# Patient Record
Sex: Male | Born: 1940 | Race: White | Hispanic: No | Marital: Married
Health system: Midwestern US, Academic
[De-identification: ages and names within clinical notes are randomized; demographics above are authoritative.]

---

## 2015-07-21 IMAGING — CR LOW_EXM
1 series · 1 of 1 positions shown · non-contrast
Comparison: none

[knee lat]
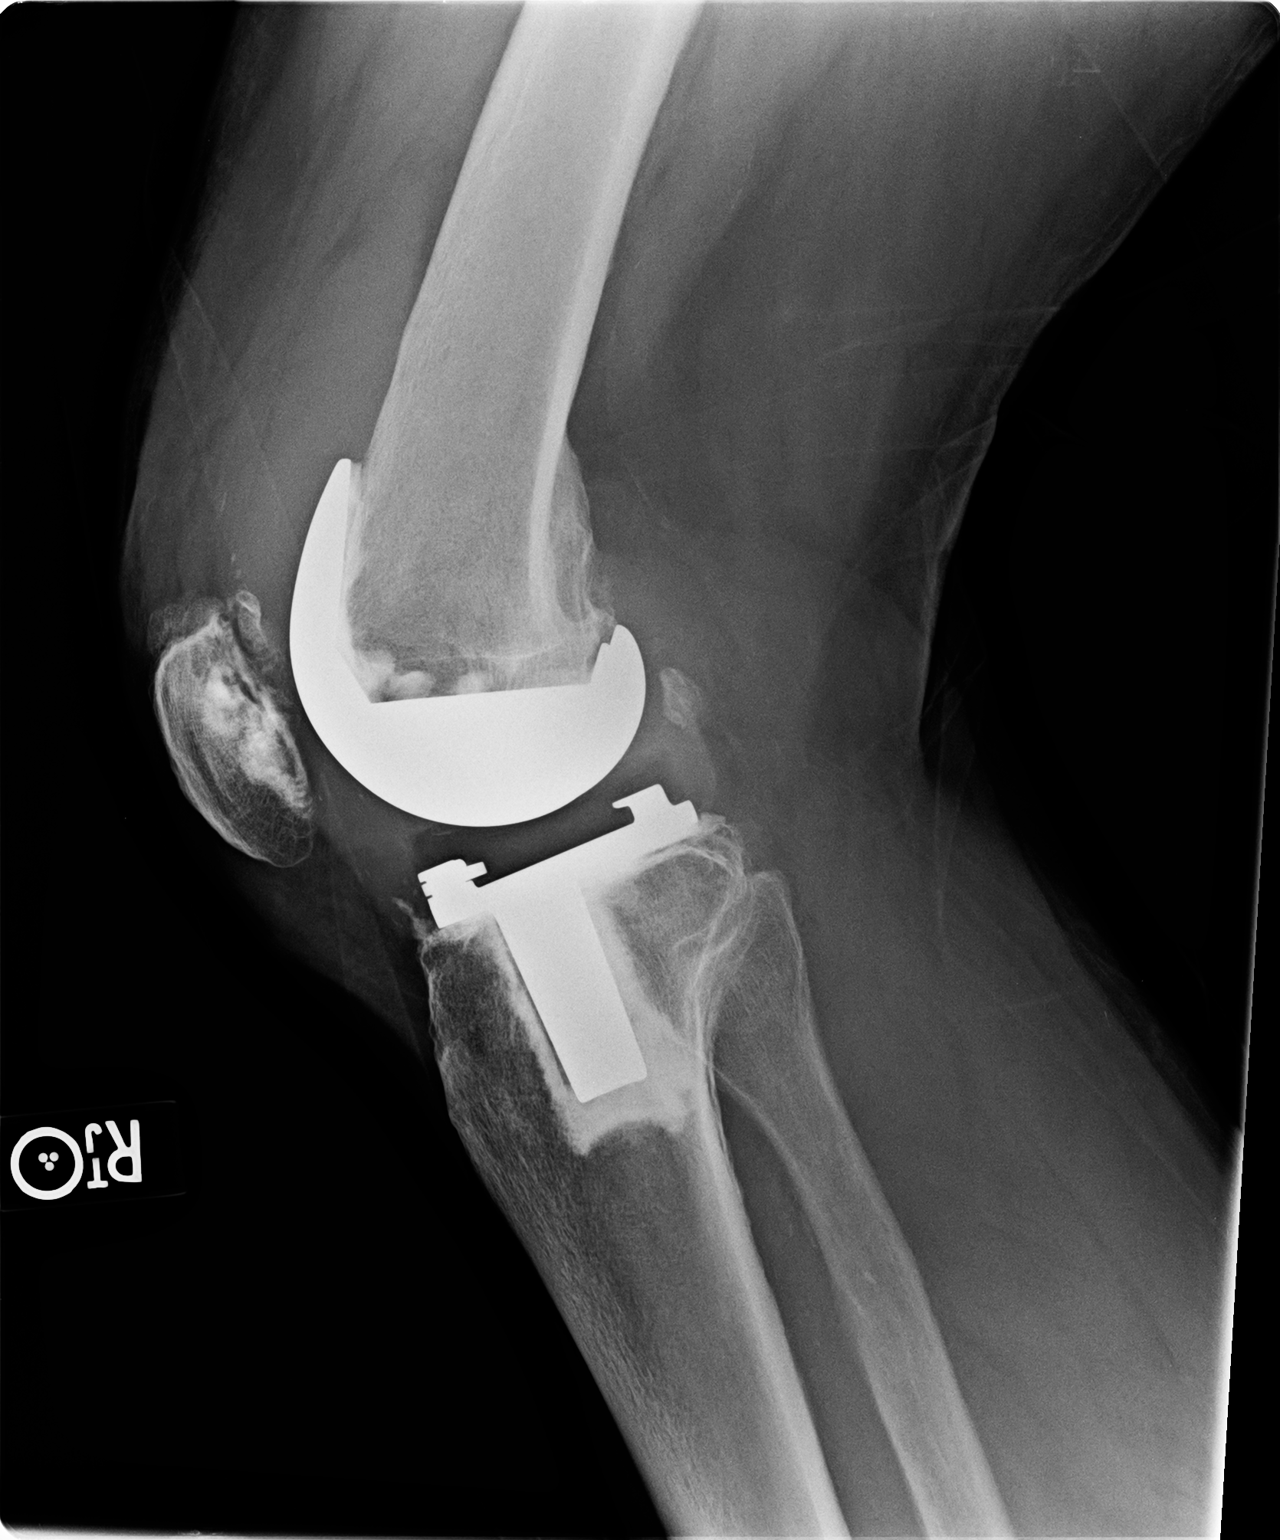

[1 of 1 positions shown; findings below may reference images not displayed]

EXAM

Right knee.

INDICATION

fell at home today. c/o pain and swelling
Fell going down driveway this morning. Pain and swelling knee and lateral
ankle

FINDINGS

AP and lateral right knee show a moderate to large joint effusion. Total knee arthroplasty in
anatomic alignment. Ossification is appreciated at the posterior superior patellar pole with
fragmented enthesophytes also suggested. There is no compelling evidence of hardware failure or
loosening.

IMPRESSION

No periprosthetic fracture. Joint effusion. There is a smoothly marginated calcification at the
posterior aspect of the superior patella.

## 2016-02-28 IMAGING — CR UP_EXM
3 series · 3 of 3 positions shown · non-contrast
Comparison: none

[hand]
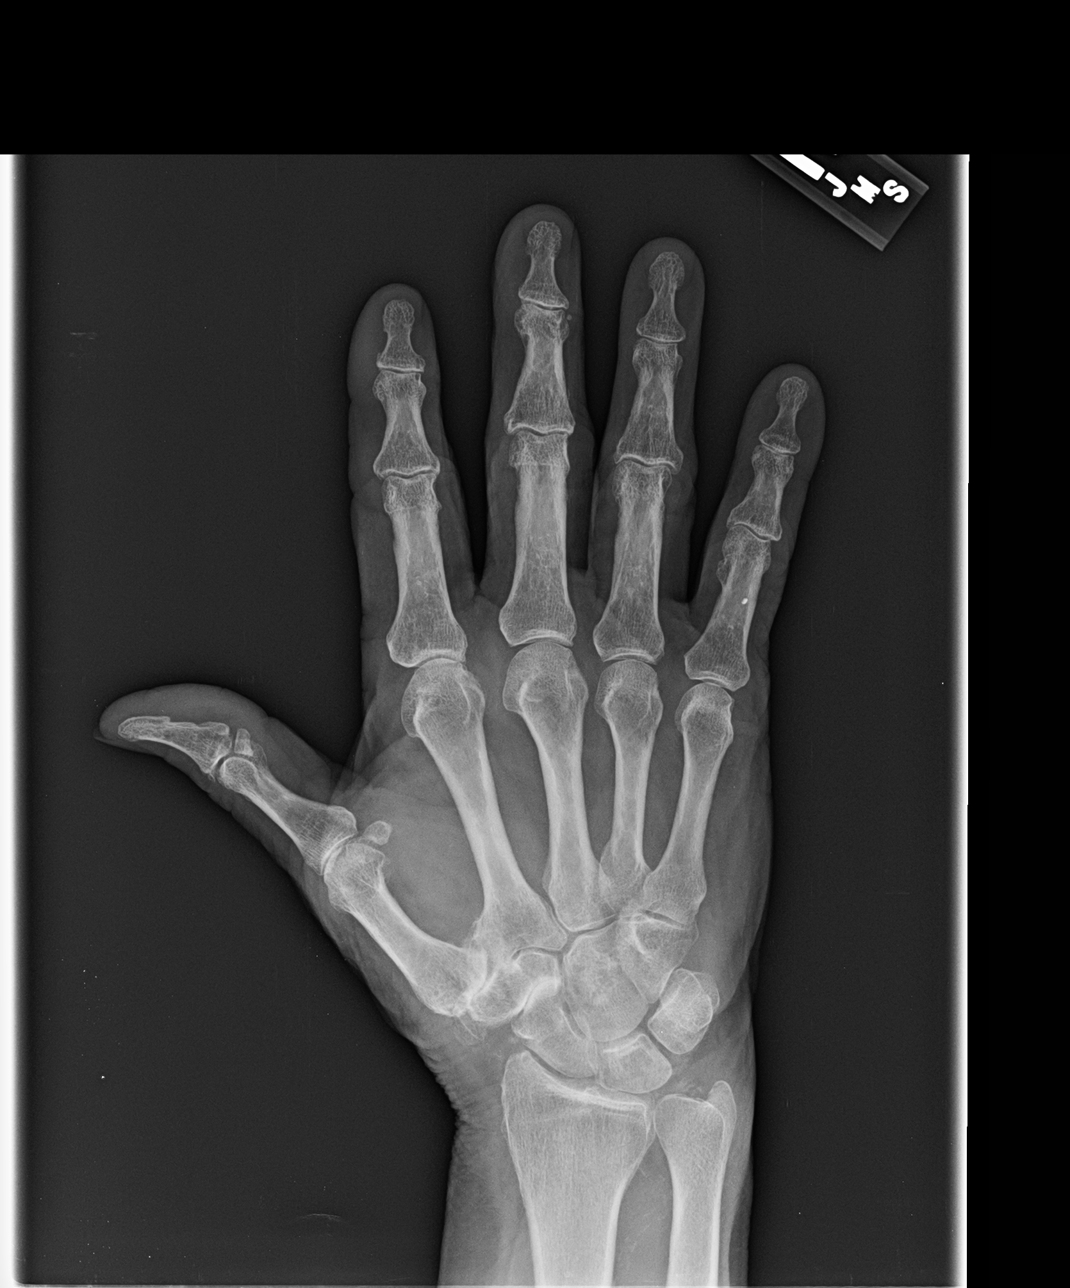

[hand obl]
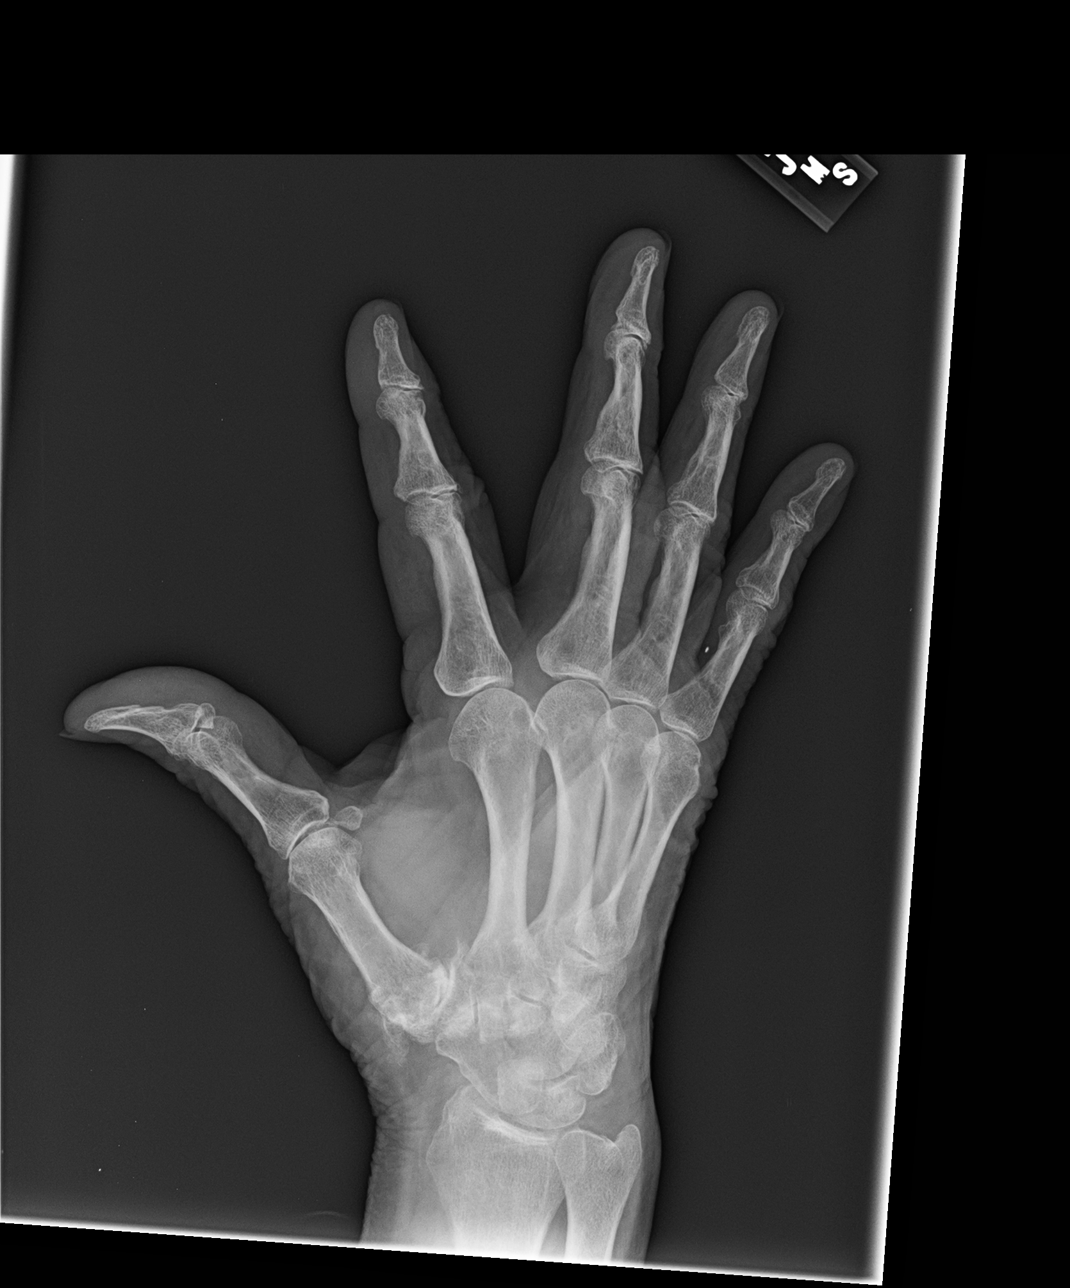

[hand lat]
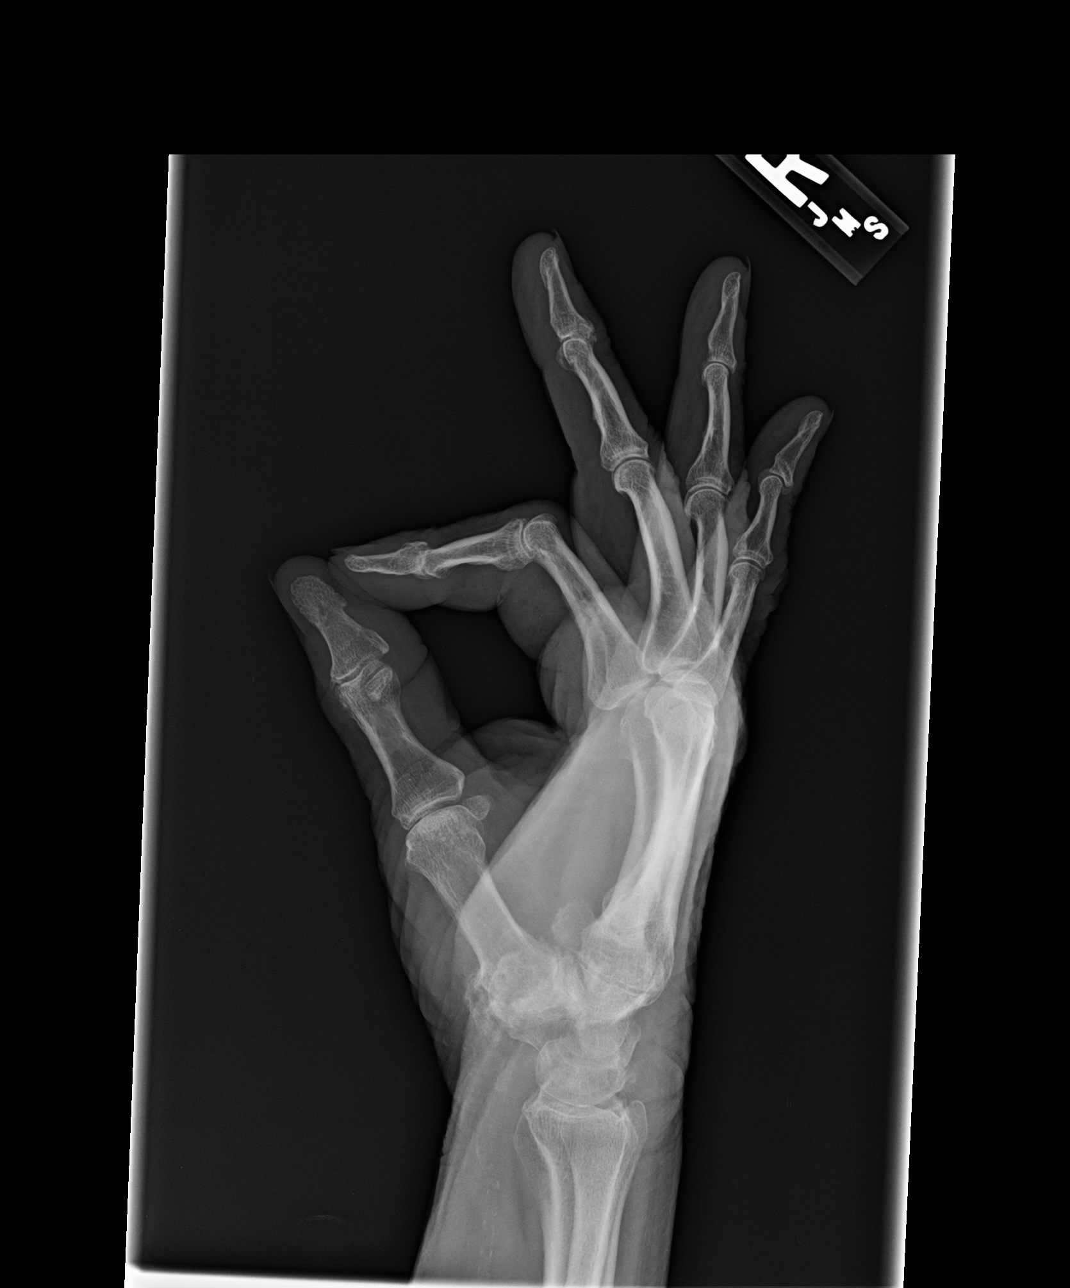

[3 of 3 positions shown; findings below may reference images not displayed]

DIAGNOSTIC STUDIES

EXAM
Right hand radiograph

INDICATION
intermittent increase of right hand numbness and tingling.
PT STATES HAS RIGHT HAND NUMBNESS AND TINGLING THAT IS REOCURRING X FEW
MONTHS. JS/TJ

TECHNIQUE
PA, oblique, and lateral views of the right hand

COMPARISONS
None

FINDINGS
The bones are diffusely demineralized. No acute fracture or malalignment. There is severe 1st
carpometacarpal degenerative joint disease with subchondral sclerosis and large osteophytes. Severe
triscaphe degenerative joint disease. Moderate degenerative joint disease of the thumb
metacarpophalangeal joint and scattered throughout the interphalangeal joints with joint space
narrowing and small osteophytes. There is mild joint space narrowing of the radiocarpal joint.
There are sclerotic changes of the capitate. Chondrocalcinosis is seen in the triangular
fibrocartilage.

IMPRESSION
Multifocal degenerative joint disease, greatest at the 1st carpometacarpal and triscaphe joints
where it is severe.

]

## 2016-09-11 IMAGING — CR PELVIS
1 series · 1 of 1 positions shown · non-contrast
Comparison: none

[l-spine ap]
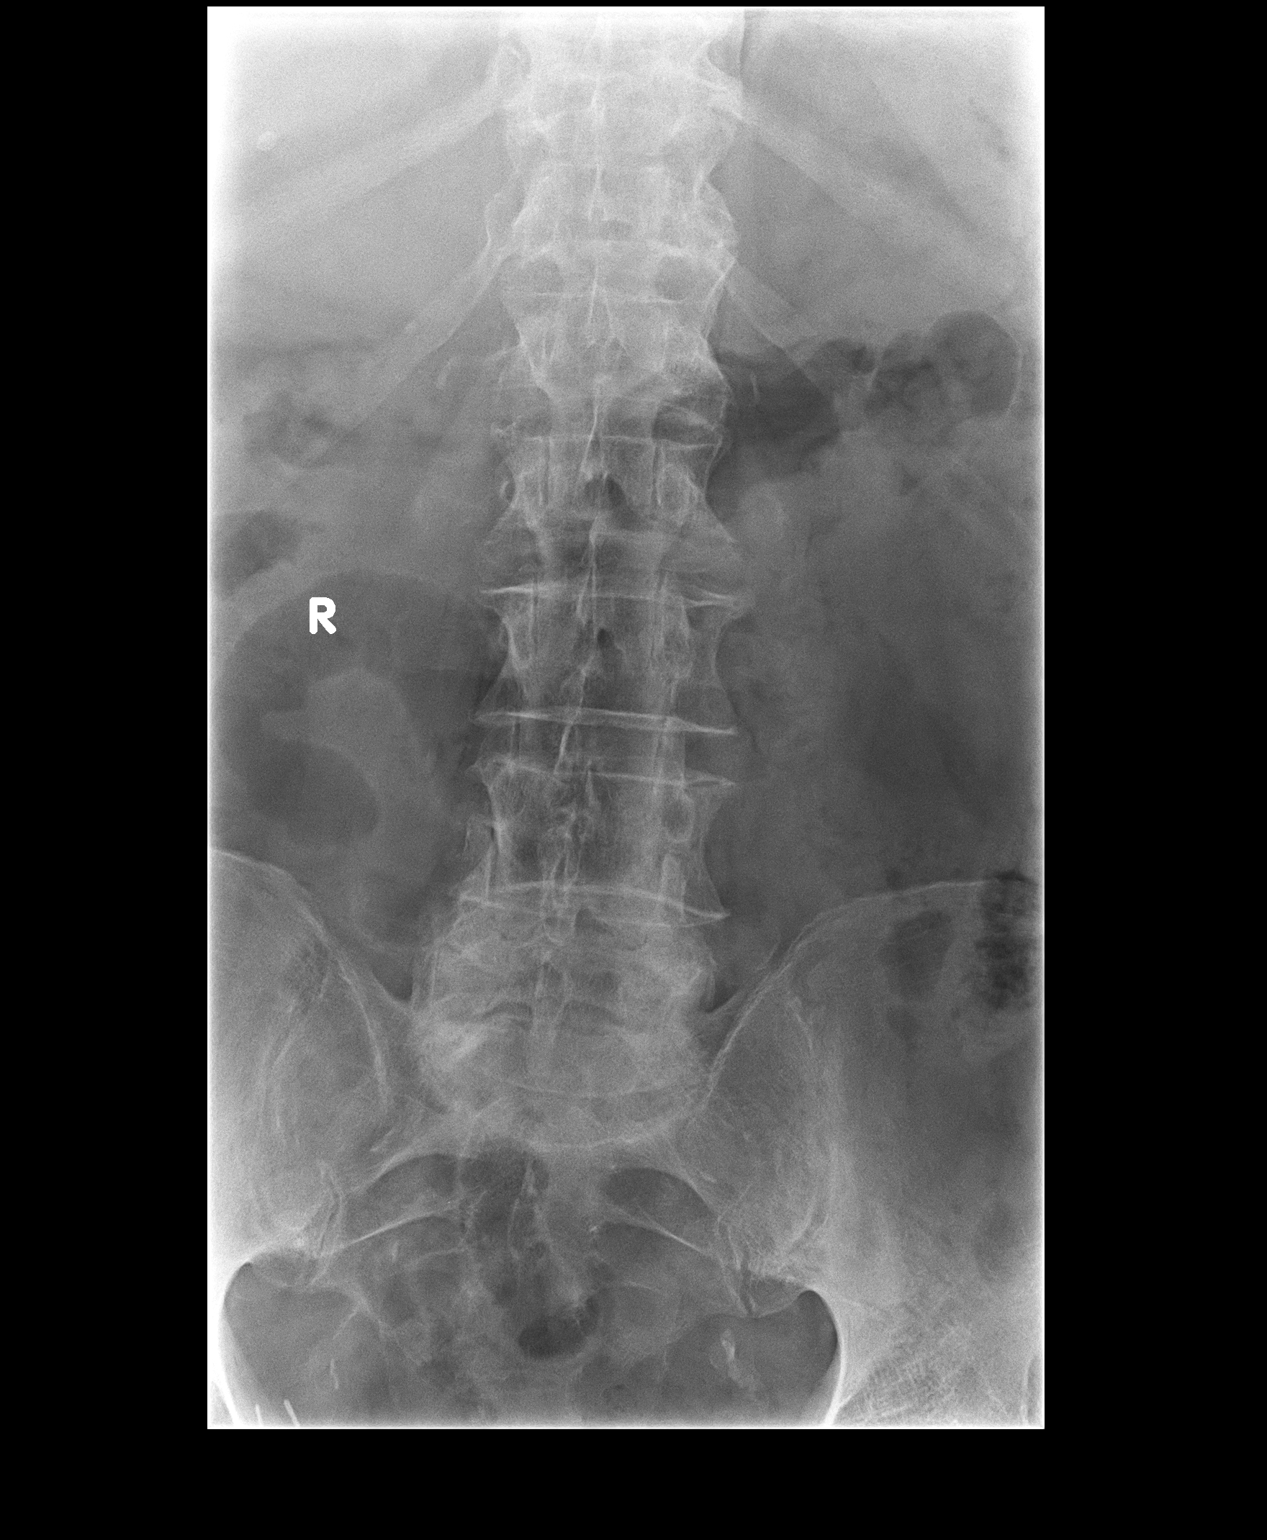

[1 of 1 positions shown; findings below may reference images not displayed]

DIAGNOSTIC STUDIES

EXAM

Lumbar spine radiograph

INDICATION

chronic intermittent low back pain and lower leg pain (bilateral)
PT STATES BILATERAL LEG PAIN X 3-4 MONTHS. HX OF BILATERAL KNEE
REPLACEMENTS. SB/TJ

TECHNIQUE

Five views of the lumbar spine

COMPARISONS

None

FINDINGS

There are 5 non-rib-bearing lumbar vertebral bodies. The bones are demineralized. No acute
fracture. There is 4 millimeter anterolisthesis of L4 on L5. There is moderate disc space narrowing
at L5-S1. Severe facet arthrosis at L4-5 and L5-S1.

IMPRESSION

1. Osteopenia.

2. Moderate degenerative disc disease at L5-S1.

3. Severe facet arthrosis at L4-5 and L5-S1.

## 2016-09-25 ENCOUNTER — Encounter: Admit: 2016-09-25 | Discharge: 2016-09-26 | Payer: MEDICARE

## 2016-09-25 DIAGNOSIS — R69 Illness, unspecified: Principal | ICD-10-CM

## 2016-10-12 LAB — PROSTATIC SPECIFIC ANTIGEN-PSA

## 2016-10-27 ENCOUNTER — Encounter: Admit: 2016-10-27 | Discharge: 2016-10-27 | Payer: MEDICARE

## 2016-10-27 ENCOUNTER — Ambulatory Visit: Admit: 2016-10-27 | Discharge: 2016-10-27 | Payer: MEDICARE

## 2016-10-27 DIAGNOSIS — I10 Essential (primary) hypertension: ICD-10-CM

## 2016-10-27 DIAGNOSIS — Z8546 Personal history of malignant neoplasm of prostate: Principal | ICD-10-CM

## 2016-10-27 DIAGNOSIS — K219 Gastro-esophageal reflux disease without esophagitis: ICD-10-CM

## 2016-10-27 DIAGNOSIS — E119 Type 2 diabetes mellitus without complications: ICD-10-CM

## 2016-10-27 DIAGNOSIS — K222 Esophageal obstruction: ICD-10-CM

## 2016-10-27 DIAGNOSIS — C61 Malignant neoplasm of prostate: Principal | ICD-10-CM

## 2016-10-27 DIAGNOSIS — E785 Hyperlipidemia, unspecified: ICD-10-CM

## 2016-10-27 DIAGNOSIS — N393 Stress incontinence (female) (male): ICD-10-CM

## 2016-10-27 DIAGNOSIS — N5231 Erectile dysfunction following radical prostatectomy: ICD-10-CM

## 2016-10-27 DIAGNOSIS — N529 Male erectile dysfunction, unspecified: ICD-10-CM

## 2016-10-27 NOTE — Progress Notes
Date of Service: 10/27/2016     Subjective:             Devin Kelly is a 76 y.o. male.    Chief Complaint   Patient presents with   ??? Prostate Cancer       History of Present Illness  Very pleasant Caucasian gentleman w/ prostate cancer s/p non-nerve sparing robotic asst lap prostatectomy (RALP) on 09/25/2011, by Dr. Laveda Norman.  Post-Prostatectomy PSA undetectable, thus far.      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 Larita Fife.  SUI significantly improved down to 1 light security pad/ day.  Denies obstructive LUTS, dysuria, gross hematuria, or UTI.    (+)baseline ED prior to non-nerve sparing RALP.  Not a quality of life issue, not interested in ED tx options.      No new urologic concerns or complaints.    HPI reviewed on 10/27/2016 & unchanged since last visit.         Review of Systems   Constitutional: Negative for activity change, appetite change, chills, diaphoresis, fatigue, fever and unexpected weight change.   HENT: Negative for congestion, hearing loss, mouth sores and sinus pressure.    Eyes: Negative for visual disturbance.   Respiratory: Negative for apnea, cough, chest tightness and shortness of breath.    Cardiovascular: Negative for chest pain, palpitations and leg swelling.   Gastrointestinal: Negative for abdominal pain, blood in stool, constipation, diarrhea, nausea, rectal pain and vomiting.   Genitourinary: Negative for decreased urine volume, difficulty urinating, discharge, dysuria, enuresis, flank pain, frequency, genital sores, hematuria, penile pain, penile swelling, scrotal swelling, testicular pain and urgency.   Musculoskeletal: Negative for arthralgias, back pain, gait problem and myalgias.   Skin: Negative for rash and wound.   Neurological: Negative for dizziness, tremors, seizures, syncope, weakness, light-headedness, numbness and headaches.   Hematological: Negative for adenopathy. Does not bruise/bleed easily. Psychiatric/Behavioral: Negative for decreased concentration and dysphoric mood. The patient is not nervous/anxious.        Objective:         ??? aspirin-caffeine 500-32.5 mg tab Take 2 Tabs by mouth twice daily.   ??? cyanocobalamin(+) (VITAMIN B-12) 100 mcg tablet Take 100 mcg by mouth twice daily.   ??? enalapril/hydrochlorothiazide (VASERETIC) 10/25 mg tablet Take 1 Tab by mouth at bedtime daily.   ??? fenofibrate nanocrystallized (TRICOR) 145 mg tablet Take 145 mg by mouth daily.   ??? fish oil /omega-3 fatty acids (SEA-OMEGA) 340/1000 mg capsule Take 4 Caps by mouth daily.   ??? glipiZIDE CR (GLUCOTROL XL) 5 mg tablet Take 5 mg by mouth daily.   ??? HYDROcodone/acetaminophen (NORCO; VICODIN) 5-325 mg tablet Take 1 Tab by mouth every 6 hours as needed for Pain   ??? metFORMIN (GLUCOPHAGE) 1,000 mg tablet Take 1,000 mg by mouth twice daily with meals.     ??? MULTIVITAMIN PO Take 1 Tab by mouth at bedtime daily.   ??? omeprazole DR(+) (PRILOSEC) 20 mg capsule Take 20 mg by mouth at bedtime daily.   ??? triamcinolone (NASACORT AQ) 55 mcg nasal inhaler Apply 2 Sprays to each nostril as directed daily as needed.       Vitals:    10/27/16 0924   BP: 132/55   Pulse: 59   Weight: 100 kg (220 lb 6.4 oz)   Height: 180.3 cm (71)       Body mass index is 30.74 kg/m???.       Physical Exam  Constitutional: He is oriented to person, place, and time. He appears well-developed and well-nourished. No distress.   HENT:   Head: Normocephalic and atraumatic.   Eyes: No scleral icterus.   Abdominal: Soft. He exhibits no distension. Hernia confirmed negative in the right inguinal area and confirmed negative in the left inguinal area.   Well healed surgical scars.  Obese.  (+)RLQ AUS reservoir scar.   Genitourinary: Testes normal and penis normal. Right testis shows no mass, no swelling and no tenderness. Right testis is descended. Left testis shows no mass, no swelling and no tenderness. Left testis is descended. Uncircumcised. No phimosis, paraphimosis, hypospadias, penile erythema or penile tenderness. No discharge found.   Genitourinary Comments: Foreskin easily retracted.  No penile lesions.  (+)AUS pump easily palpated in (R) hemiscrotum.  AUS pump in good position.   Lymphadenopathy:        Right: No inguinal adenopathy present.        Left: No inguinal adenopathy present.   Neurological: He is alert and oriented to person, place, and time. No cranial nerve deficit (II-XII grossly intact).   Skin: Skin is warm and dry. No erythema. No pallor.   Psychiatric: He has a normal mood and affect. His behavior is normal.   Vitals reviewed.        Labs:  Outside PSA (10/12/2016) < 0.13 ng/mL.  Will scan to EMR.      Assessment and Plan:    Problem   History of Prostate Cancer    PSA (07/19/2011) = 5.49 ng/mL.  PNBx (08/10/2011): cT1c; (L) Gleason 3+3=6, 4/6 cores, 20-55%; (R) Gleason 3+3=6, 4/6 cores, 30-80%; Dr. Larwance Rote.  Non-Nerve Sparing Robotic Asst Lap Prostatectomy (RALP) -- 09/25/2011; Dr. Laveda Norman.  pT2c N0 Mx, Gleason 3+3=6, Margins Negative.     Fleeta Emmer (Stress Urinary Incontinence), Male    Post-prostatectomy SUI; 8-10 pads/ d.  Artificial urinary sphincter (AUS) -- 07/07/2013; Dr. Larita Fife.  Post-AUS ~ 1 light security pad/ d.     Erectile Dysfunction Following Radical Prostatectomy    (+)baseline ED prior to prostatectomy.  Failed Viagra & Levitra.  S/p Non-nerve sparing prostatectomy.         History of prostate cancer  PSA reviewed & remains undetectable.  RTC 1 yr w/ outside PSA.    Erectile dysfunction following radical prostatectomy  Not a quality of life issue at this time.  No further intervention required at this time.    SUI (stress urinary incontinence), male  SUI significantly improved w/ AUS.    He remains very pleased w/ results.    Orders Placed This Encounter   ??? PROSTATIC SPECIFIC ANTIGEN-PSA       Marin Roberts, PA-C  Urology

## 2016-10-28 ENCOUNTER — Encounter: Admit: 2016-10-28 | Discharge: 2016-10-28 | Payer: MEDICARE

## 2016-10-28 DIAGNOSIS — E119 Type 2 diabetes mellitus without complications: ICD-10-CM

## 2016-10-28 DIAGNOSIS — E785 Hyperlipidemia, unspecified: ICD-10-CM

## 2016-10-28 DIAGNOSIS — N529 Male erectile dysfunction, unspecified: ICD-10-CM

## 2016-10-28 DIAGNOSIS — I10 Essential (primary) hypertension: ICD-10-CM

## 2016-10-28 DIAGNOSIS — C61 Malignant neoplasm of prostate: Principal | ICD-10-CM

## 2016-10-28 DIAGNOSIS — N393 Stress incontinence (female) (male): ICD-10-CM

## 2016-10-28 DIAGNOSIS — K222 Esophageal obstruction: ICD-10-CM

## 2016-10-28 DIAGNOSIS — K219 Gastro-esophageal reflux disease without esophagitis: ICD-10-CM

## 2016-10-28 NOTE — Assessment & Plan Note
PSA reviewed & remains undetectable.  RTC 1 yr w/ outside PSA.

## 2016-10-28 NOTE — Assessment & Plan Note
Not a quality of life issue at this time.  No further intervention required at this time.

## 2016-10-28 NOTE — Assessment & Plan Note
SUI significantly improved w/ AUS.    He remains very pleased w/ results.

## 2016-10-30 ENCOUNTER — Encounter: Admit: 2016-10-30 | Discharge: 2016-10-30 | Payer: MEDICARE

## 2016-10-30 DIAGNOSIS — Z8546 Personal history of malignant neoplasm of prostate: Principal | ICD-10-CM

## 2016-11-10 ENCOUNTER — Ambulatory Visit: Admit: 2016-11-10 | Discharge: 2016-11-11 | Payer: MEDICARE

## 2016-11-10 ENCOUNTER — Encounter: Admit: 2016-11-10 | Discharge: 2016-11-10 | Payer: MEDICARE

## 2016-11-10 DIAGNOSIS — I10 Essential (primary) hypertension: ICD-10-CM

## 2016-11-10 DIAGNOSIS — N393 Stress incontinence (female) (male): ICD-10-CM

## 2016-11-10 DIAGNOSIS — C61 Malignant neoplasm of prostate: Principal | ICD-10-CM

## 2016-11-10 DIAGNOSIS — R69 Illness, unspecified: Principal | ICD-10-CM

## 2016-11-10 DIAGNOSIS — E785 Hyperlipidemia, unspecified: ICD-10-CM

## 2016-11-10 DIAGNOSIS — K219 Gastro-esophageal reflux disease without esophagitis: ICD-10-CM

## 2016-11-10 DIAGNOSIS — K222 Esophageal obstruction: ICD-10-CM

## 2016-11-10 DIAGNOSIS — N529 Male erectile dysfunction, unspecified: ICD-10-CM

## 2016-11-10 DIAGNOSIS — E119 Type 2 diabetes mellitus without complications: ICD-10-CM

## 2016-11-10 NOTE — Progress Notes
[facility]  NEW PATIENT HISTORY AND PHYSICAL  Subjective     Chief Complaint: LBP    HPI: Devin Kelly is a 76 y.o. male who  has a past medical history of Diabetes mellitus, type II (HCC); Dyslipidemia; ED (erectile dysfunction); Esophageal stricture; GERD (gastroesophageal reflux disease); Hypertension; Prostate cancer (HCC) (08/10/2011); and SUI (stress urinary incontinence), male. who now presents for initial consultation.    The pain is in the low back, which is more minor.   The worse pain is radiating pain down the bilateral posterior legs R>L.    Pain started: 6 months ago    Initial inciting injury or event: No    Numbness/tingling: Yes    The pain ranges 2-9/10    The pain is described as stabbing, shooting, sharp.     The pain is exacerbated by walking at times.     The pain is partially alleviated by taking weight off the legs and medications.     Denies substantial muscle tightness/stiffness.          PRIOR MEDICATIONS:   Effective  NSAID    Ineffective      Unable to tolerate      Never  Gabapentin  Lyrica  Ami/Nortriptyline  Cymbalta  Tizanidine      PRIOR INTERVENTIONS:  No spine surgery or injections  Effective      Ineffective  Chiropractor  Exercise, limited by pain      Devin Kelly denies any recent fevers, chills, infection, antibiotics, bowel or bladder incontinence, saddle anesthesia, bleeding issues, or recent anticoagulant.     ROS: All 14 systems reviewed and found to be negative except as above and as follows.    Past Medical History:  Past Medical History:   Diagnosis Date   ??? Diabetes mellitus, type II (HCC)    ??? Dyslipidemia    ??? ED (erectile dysfunction)     Baseline ED prior to prostatectomy.  Post-prostatectomy ED.   ??? Esophageal stricture    ??? GERD (gastroesophageal reflux disease)    ??? Hypertension    ??? Prostate cancer (HCC) 08/10/2011   ??? SUI (stress urinary incontinence), male     Post-prostatectomy SUI       Family History: Family History   Problem Relation Age of Onset   ??? Diabetes Other    ??? Asthma Sister    ??? Asthma Brother    ??? Diabetes Maternal Aunt    ??? 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   Social History     Social History   ??? Marital status: Married     Spouse name: N/A   ??? Number of children: N/A   ??? Years of education: N/A     Occupational History   ??? Not on file.     Social History Main Topics   ??? Smoking status: Former Smoker     Packs/day: 1.00     Years: 20.00     Types: Cigarettes     Quit date: 12/19/1991   ??? Smokeless tobacco: Never Used   ??? Alcohol use No      Comment: Quit 1996   ??? Drug use: No   ??? Sexual activity: Not Currently     Partners: Female     Other Topics Concern   ??? Not on file  Social History Narrative   ??? No narrative on file       Allergies:  Allergies   Allergen Reactions   ??? Crestor [Rosuvastatin] MUSCLE PAIN       Medications:    Current Outpatient Prescriptions:   ???  aspirin-caffeine 500-32.5 mg tab, Take 2 Tabs by mouth twice daily., Disp: , Rfl:   ???  cyanocobalamin(+) (VITAMIN B-12) 100 mcg tablet, Take 100 mcg by mouth twice daily., Disp: , Rfl:   ???  enalapril/hydrochlorothiazide (VASERETIC) 10/25 mg tablet, Take 1 Tab by mouth at bedtime daily., Disp: , Rfl:   ???  fenofibrate nanocrystallized (TRICOR) 145 mg tablet, Take 145 mg by mouth daily., Disp: , Rfl:   ???  fish oil /omega-3 fatty acids (SEA-OMEGA) 340/1000 mg capsule, Take 4 Caps by mouth daily., Disp: , Rfl:   ???  glipiZIDE CR (GLUCOTROL XL) 5 mg tablet, Take 5 mg by mouth daily., Disp: , Rfl:   ???  HYDROcodone/acetaminophen (NORCO; VICODIN) 5-325 mg tablet, Take 1 Tab by mouth every 6 hours as needed for Pain, Disp: 30 Tab, Rfl: 0  ???  metFORMIN (GLUCOPHAGE) 1,000 mg tablet, Take 1,000 mg by mouth twice daily with meals.  , Disp: , Rfl:   ???  MULTIVITAMIN PO, Take 1 Tab by mouth at bedtime daily., Disp: , Rfl: ???  omeprazole DR(+) (PRILOSEC) 20 mg capsule, Take 20 mg by mouth at bedtime daily., Disp: , Rfl:   ???  triamcinolone (NASACORT AQ) 55 mcg nasal inhaler, Apply 2 Sprays to each nostril as directed daily as needed., Disp: , Rfl:     Physical examination:   BP 131/82  - Pulse 67  - Resp 15  - Ht 180.3 cm (71)  - Wt 100.7 kg (222 lb)  - BMI 30.96 kg/m???   Pain Score: Five    General: The patient is a well-developed, somewhat overweight 76 y.o. male in no acute distress.   HEENT: Head is normocephalic and atraumatic. Pupils are equal and reactive to light bilaterally.   Cardiac: Based on palpation, pulse appears to be regular rate and rhythm.   Pulmonary: The patient has unlabored respirations and bilateral symmetric chest excursion.   Abdomen: Soft, nontender, and nondistended. There is no rebound or guarding.   Extremities: No clubbing, cyanosis, or edema.     Neurologic:   The patient is alert and oriented times 3.   Cranial nerves II through XII are intact without any focal deficits.   There is no sensory deficit to light touch or pinprick in the affected areas. There is no allodynia noted.    Musculoskeletal:   Gait is normal.    Well-healed scars in bilateral knees from TKR.     L-Spine   There is no point vertebral tenderness in the midline.    There is mild-min low lumbar paraspinal tenderness. Paraspinal muscle tone is normal.  Facet loading is negative.  There is no tenderness or radiating pain with palpation over the SI joints, piriformis, or greater trochanteric bursae bilaterally.  ROM with flexion, extension, rotation, and lateral bending is intact.  Strength is equal and adequate bilaterally in the flexors and extensors of the bilateral lower extremities.   Reflexes are 2/4 at the patella and achilles bilaterally.   SLR is positive bilaterally.      MRI L-Spine Results:  [facility]  09/2016  L1-2 mild FA  L2-3 Diff DB. Mild FA. Mod R and mild-mod L NFS  L3-4 Diff DB. Mild FA. Mild SS.  Mild R and mil L NFS L4-5 Post DOC. Sev FA. Mod-sev SS. Mod-sev R and L NFS.   L5-S1 Diff DB with central protrusion. Mild SS. Mild FA. Mild-mod R and mod-sev L NFS.       Last Cr and LFT's:  Creatinine   Date Value Ref Range Status   07/29/2014 0.95 0.4 - 1.24 MG/DL Final          Assessment:    Devin Kelly is a 76 y.o. male who  has a past medical history of Diabetes mellitus, type II (HCC); Dyslipidemia; ED (erectile dysfunction); Esophageal stricture; GERD (gastroesophageal reflux disease); Hypertension; Prostate cancer (HCC) (08/10/2011); and SUI (stress urinary incontinence), male. who presents for evaluation of LBP.     The pain complaints are most likely due to:    1. Lumbosacral radiculopathy  [place] [facility] SP[facility] INJECT SNRB/TFESI LUMBAR/SACRAL   2. DDD (degenerative disc disease), lumbosacral  [place] [facility] INJECT SNRB/TFESI LUMBAR/SACRAL       Patient has had an adequate trial of > 3 months of rest, exercise, NSAID's, multimodal treatment, and the passage of time without improvement of symptoms. The pain has significant impact on the daily quality of life.     Plan:    1. Plan for Bilateral L5-S1 TFESI at first available appointment.   2. If pain persists, consider trial of gabapentin or Cymbalta.   3. Follow up as needed.     Risks/benefits of all pharmacologic and interventional treatments discussed and questions answered.     Thank you for this kind referral for consultation. Please feel free to contact me with any questions or concerns.

## 2016-11-11 DIAGNOSIS — M5417 Radiculopathy, lumbosacral region: Principal | ICD-10-CM

## 2016-11-11 DIAGNOSIS — M5137 Other intervertebral disc degeneration, lumbosacral region: ICD-10-CM

## 2016-11-15 ENCOUNTER — Ambulatory Visit: Admit: 2016-11-15 | Discharge: 2016-11-16 | Payer: MEDICARE

## 2016-11-15 ENCOUNTER — Encounter: Admit: 2016-11-15 | Discharge: 2016-11-15 | Payer: MEDICARE

## 2016-11-15 DIAGNOSIS — M5416 Radiculopathy, lumbar region: Principal | ICD-10-CM

## 2016-11-16 DIAGNOSIS — M5416 Radiculopathy, lumbar region: Principal | ICD-10-CM

## 2016-11-20 ENCOUNTER — Encounter: Admit: 2016-11-20 | Discharge: 2016-11-20 | Payer: MEDICARE

## 2016-11-20 DIAGNOSIS — M5416 Radiculopathy, lumbar region: Principal | ICD-10-CM

## 2016-11-20 NOTE — Telephone Encounter
Patient requests to repeat the Bil TFESI with Dr. Zollie Scale. The first one helped, but not for long enough. He would like to schedule 2 more TFESI, review that Dr. Zollie Scale prefers to schedule one at a time to make sure it is necessary.

## 2016-12-05 ENCOUNTER — Encounter: Admit: 2016-12-05 | Discharge: 2016-12-05 | Payer: MEDICARE

## 2016-12-05 ENCOUNTER — Ambulatory Visit: Admit: 2016-12-05 | Discharge: 2016-12-06 | Payer: MEDICARE

## 2016-12-05 ENCOUNTER — Ambulatory Visit: Admit: 2016-12-05 | Discharge: 2016-12-05 | Payer: MEDICARE

## 2016-12-05 DIAGNOSIS — M5416 Radiculopathy, lumbar region: Principal | ICD-10-CM

## 2016-12-18 ENCOUNTER — Encounter: Admit: 2016-12-18 | Discharge: 2016-12-18 | Payer: MEDICARE

## 2016-12-18 DIAGNOSIS — M549 Dorsalgia, unspecified: Principal | ICD-10-CM

## 2016-12-27 ENCOUNTER — Ambulatory Visit: Admit: 2016-12-27 | Discharge: 2016-12-27 | Payer: MEDICARE

## 2016-12-27 ENCOUNTER — Encounter: Admit: 2016-12-27 | Discharge: 2016-12-27 | Payer: MEDICARE

## 2016-12-27 DIAGNOSIS — M549 Dorsalgia, unspecified: Principal | ICD-10-CM

## 2016-12-27 DIAGNOSIS — E119 Type 2 diabetes mellitus without complications: ICD-10-CM

## 2016-12-27 DIAGNOSIS — M48061 Spinal stenosis, lumbar region without neurogenic claudication: ICD-10-CM

## 2016-12-27 DIAGNOSIS — N529 Male erectile dysfunction, unspecified: ICD-10-CM

## 2016-12-27 DIAGNOSIS — I10 Essential (primary) hypertension: ICD-10-CM

## 2016-12-27 DIAGNOSIS — M4316 Spondylolisthesis, lumbar region: ICD-10-CM

## 2016-12-27 DIAGNOSIS — C61 Malignant neoplasm of prostate: Principal | ICD-10-CM

## 2016-12-27 DIAGNOSIS — N393 Stress incontinence (female) (male): ICD-10-CM

## 2016-12-27 DIAGNOSIS — M48062 Spinal stenosis, lumbar region with neurogenic claudication: Principal | ICD-10-CM

## 2016-12-27 DIAGNOSIS — K219 Gastro-esophageal reflux disease without esophagitis: ICD-10-CM

## 2016-12-27 DIAGNOSIS — K222 Esophageal obstruction: ICD-10-CM

## 2016-12-27 DIAGNOSIS — E785 Hyperlipidemia, unspecified: ICD-10-CM

## 2016-12-27 NOTE — Progress Notes
Date of Service: 12/27/2016     Subjective:            History of Present Illness    Devin Kelly is a 76 y.o. male visit of history of prostate cancer approximate 4-5 years ago status post prostatectomy, diabetes, hypertension who presents with 6 months of bilateral posterior thigh and leg pain.  He states that there were no inciting events that caused the pain.  He states that the pain is nonradiating and intermittent.  He denies any significant weakness, numbness or tingling.  He has tried 2 epidural steroids with minimal relief.  He has not tried physical therapy.  He is otherwise ambulating without any difficulties.  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    There is a very distant history of smoking.  After his prostatectomy he developed urinary incontinence for which a urinary reservoir was placed by urology.  He therefore urology to change the settings of the reservoir if he needs any kind of indwelling catheter.    Past Medical History:   Past Medical History:   Diagnosis Date   ??? Diabetes mellitus, type II (HCC)    ??? Dyslipidemia    ??? ED (erectile dysfunction)     Baseline ED prior to prostatectomy.  Post-prostatectomy ED.   ??? Esophageal stricture    ??? GERD (gastroesophageal reflux disease)    ??? Hypertension    ??? Prostate cancer (HCC) 08/10/2011   ??? SUI (stress urinary incontinence), male     Post-prostatectomy SUI       Past Surgical History:   Past Surgical History:   Procedure Laterality Date   ??? RADICAL PROSTATECTOMY  09/25/2011    Non-Nerve Sparing RALP; Dr. Laveda Norman   ??? INCONTINENCE SURGERY  07/07/2013    Artificial Urinary Sphincter (AUS) placement; Dr. Larita Fife   ??? PR CARPECTOMY ALL BONES PROXIMAL ROW Left 08/04/2014    LEFT WRIST PROXIMAL ROW CARPECTOMY performed by Desiree Hane, [facility] at [facility]   ??? DENTAL SURGERY     ??? ESOPHAGEAL DILATATION      x4   ??? HX FEMUR FRACTURE TX     ??? HX JOINT REPLACEMENT bilateral knee        Family History: family history includes Asthma in his brother and sister; Cancer in his maternal uncle; Diabetes in his maternal aunt and another family member.    Social History:   Social History     Social History   ??? Marital status: Married     Spouse name: N/A   ??? Number of children: N/A   ??? Years of education: N/A     Occupational History   ??? Not on file.     Social History Main Topics   ??? Smoking status: Former Smoker     Packs/day: 1.00     Years: 20.00     Types: Cigarettes     Quit date: 12/19/1991   ??? Smokeless tobacco: Never Used   ??? Alcohol use No      Comment: Quit 1996   ??? Drug use: No   ??? Sexual activity: Not Currently     Partners: Female     Other Topics Concern   ??? Not on file     Social History Narrative   ??? No narrative on file       Allergies:   Allergies   Allergen Reactions   ??? Crestor [Rosuvastatin] MUSCLE  PAIN                      Review of Systems   Genitourinary: Positive for frequency.   All other systems reviewed and are negative.        Objective:         ??? aspirin-caffeine 500-32.5 mg tab Take 2 Tabs by mouth twice daily.   ??? cyanocobalamin(+) (VITAMIN B-12) 100 mcg tablet Take 100 mcg by mouth twice daily.   ??? enalapril/hydrochlorothiazide (VASERETIC) 10/25 mg tablet Take 1 Tab by mouth at bedtime daily.   ??? fenofibrate nanocrystallized (TRICOR) 145 mg tablet Take 145 mg by mouth daily.   ??? fish oil /omega-3 fatty acids (SEA-OMEGA) 340/1000 mg capsule Take 4 Caps by mouth daily.   ??? glipiZIDE CR (GLUCOTROL XL) 5 mg tablet Take 5 mg by mouth daily.   ??? HYDROcodone/acetaminophen (NORCO; VICODIN) 5-325 mg tablet Take 1 Tab by mouth every 6 hours as needed for Pain   ??? metFORMIN (GLUCOPHAGE) 1,000 mg tablet Take 1,000 mg by mouth twice daily with meals.     ??? MULTIVITAMIN PO Take 1 Tab by mouth at bedtime daily.   ??? omeprazole DR(+) (PRILOSEC) 20 mg capsule Take 20 mg by mouth at bedtime daily.     Vitals:    12/27/16 0940   BP: 119/80   Pulse: 67   Resp: 14 Temp: 36.6 ???C (97.8 ???F)   TempSrc: Oral   SpO2: 100%   Weight: 98 kg (216 lb)   Height: 180.3 cm (71)     Body mass index is 30.13 kg/m???.       Physical Exam   Constitutional: He appears well-developed and well-nourished.   HENT:   Head: Normocephalic and atraumatic.   Eyes: EOM are normal.   Neck: Normal range of motion.   Cardiovascular: Normal rate, regular rhythm and normal heart sounds.    Pulmonary/Chest: Effort normal and breath sounds normal.   Abdominal: Soft.   Musculoskeletal: Normal range of motion.   Skin: Skin is warm.   Psychiatric: He has a normal mood and affect.     Ortho Exam    CN II - IX intact on exam  5/5 strength in the upper and lower extremities  Sensation intact to sharp and light touch in lower extremities    Radiology:  Degenerative disc disease with disc bulging at all 4 5 causing central stenosis as well as bilateral foraminal stenosis, right worse than left.  There is also degenerative disc disease with mild central stenosis at L5-S1.  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  Patient would benefit from decompression at least at the L4-5 level which likely would provide him with significant benefit given the stenosis at that level.  However he has spondylolisthesis at L4-5 of about a few millimeters which could get worse in the long run after the laminectomy.  This was discussed with the patient and he understands the potential risks.  He would need a decompression and fusion of the lumbar spine.  I explained to him that I have a less is more approach with similar cases.  Thing  undergoing a minimally invasive lumbar laminectomy has the best chance of maintaining structural stability of the spine still treating the underlying stenosis.  There is a chance probably about 20% that he will go on to progress and require a lumbar fusion.  Even without the surgery there is a chance of him going on to progress further radiologically.  He appears to have a good understanding of situation.  He will need a CT of the lumbar spine prior to surgery.  The risks and benefits of the surgery were discussed with the patient and he understood.  He himself would prefer a smaller surgery right now and appreciated quicker recovery.  We will schedule him for an L4-5 MIS laminectomy plan to see him back with a CT scan as well as for PFTs.

## 2017-01-09 DIAGNOSIS — Z01810 Encounter for preprocedural cardiovascular examination: ICD-10-CM

## 2017-01-10 ENCOUNTER — Ambulatory Visit: Admit: 2017-01-10 | Discharge: 2017-01-10 | Payer: MEDICARE

## 2017-01-10 ENCOUNTER — Ambulatory Visit: Admit: 2017-01-10 | Discharge: 2017-01-11 | Payer: MEDICARE

## 2017-01-10 ENCOUNTER — Encounter: Admit: 2017-01-10 | Discharge: 2017-01-10 | Payer: MEDICARE

## 2017-01-10 DIAGNOSIS — N529 Male erectile dysfunction, unspecified: ICD-10-CM

## 2017-01-10 DIAGNOSIS — E119 Type 2 diabetes mellitus without complications: ICD-10-CM

## 2017-01-10 DIAGNOSIS — C61 Malignant neoplasm of prostate: Principal | ICD-10-CM

## 2017-01-10 DIAGNOSIS — K219 Gastro-esophageal reflux disease without esophagitis: ICD-10-CM

## 2017-01-10 DIAGNOSIS — N393 Stress incontinence (female) (male): ICD-10-CM

## 2017-01-10 DIAGNOSIS — K222 Esophageal obstruction: ICD-10-CM

## 2017-01-10 DIAGNOSIS — I10 Essential (primary) hypertension: ICD-10-CM

## 2017-01-10 DIAGNOSIS — E785 Hyperlipidemia, unspecified: ICD-10-CM

## 2017-01-10 DIAGNOSIS — M4316 Spondylolisthesis, lumbar region: Secondary | ICD-10-CM

## 2017-01-10 DIAGNOSIS — M1990 Unspecified osteoarthritis, unspecified site: ICD-10-CM

## 2017-01-10 DIAGNOSIS — Z01810 Encounter for preprocedural cardiovascular examination: Principal | ICD-10-CM

## 2017-01-10 LAB — BASIC METABOLIC PANEL
Lab: 103 MMOL/L — ABNORMAL LOW (ref 98–110)
Lab: 137 MMOL/L — ABNORMAL LOW (ref 137–147)
Lab: 24 MMOL/L (ref 21–30)
Lab: 3.6 MMOL/L — ABNORMAL LOW (ref 3.5–5.1)

## 2017-01-10 LAB — CBC: Lab: 4.8 10*3/uL (ref 4.5–11.0)

## 2017-01-10 NOTE — Pre-Anesthesia Patient Instructions
GENERAL INFORMATION    Before you come to the hospital  ??? Make arrangements for a responsible adult to drive you home and stay with you for 24 hours following surgery.  ??? Bath/Shower Instructions  ??? Take a bath or shower using the special soap given to you in PAC. Use half the bottle the night before, and the other half the morning of your procedure. Use clean towels with each bath or shower.  ??? Put on clean clothes after bath or shower.  Avoid using lotion and oils.  ??? If you are having surgery above the waist, wear a shirt that fastens up the front.  ??? Sleep on clean sheets if bath or shower is done the night before procedure.  ??? Leave money, credit cards, jewelry, and any other valuables at home. [facility] is not responsible for the loss or breakage of personal items.  ??? Remove nail polish, makeup and all jewelry (including piercings) before coming to the hospital.  ??? The morning of your procedure:  ??? brush your teeth and tongue  ??? do not smoke  ??? do not shave the area where you will have surgery    What to bring to the hospital  ??? ID/ Insurance Card  ??? Medical Device card  ??? Official documents for legal guardianship   ??? Copy of your Living Will, Advanced Directives, and/or Durable Power of Attorney   ??? Small bag with a few personal belongings  ??? CPAP/BiPAP machine (including all supplies)  ??? Walker,cane, or motorized scooter  ??? Cases for glasses/hearing aids/contact lens (bring solutions for contacts)  ??? Dress in clean, loose, comfortable clothing     Eating or drinking before surgery  ??? Do not eat or drink anything after 11:00 p.m. the day before your procedure (including gum, mints, candy, or chewing tobacco) OR follow the specific instructions you were given by your Surgeon.  ??? You may have WATER ONLY up to 2 hours before arriving at the hospital.  ??? Other instructions:      Other instructions  Notify your surgeon if: ??? you become ill with a cough, fever, sore throat, nausea, vomiting or flu-like symptoms  ??? you have any open wounds/sores that are red, painful, draining, or are new since you last saw  the doctor  ??? you need to cancel your procedure  ??? You will receive a call with your surgery arrival time from between 2:30pm and 4:30pm the last business day before your procedure.  If you do not receive a call, please call [phone] before 4:30pm or [phone] after 4:30pm.    Notify us at [facility]: [phone]  ??? if you need to cancel your procedure  ??? if you are going to be late    Arrival at the hospital  [facility]  [address]  [place], [place] [address]    ? Park in the P5 parking garage located at [employer], [place], [place] [address].   ? Judee Clara parking is available in front of [employer] [facility] between the hours of 7:00 am and 4:00 pm Monday through Friday.  ? If parking in the P5 garage, take the east elevators in the parking garage to the second level and walk to the entrance of the [employer] [facility].    ? Enter through the 1st floor main entrance and check in with Information Desk.   ? If you are a woman between the ages of 71  and 55, and have not had a hysterectomy, you will be asked for a urine sample prior to surgery.  Please do not urinate before arriving in the Surgery Waiting Room.  Once there, check in and let the attendant know if you need to provide a sample.

## 2017-01-10 NOTE — Progress Notes
Is evidence of a subtle spinal listhesis with associated moderate to severe central canal and lateral recess stenosis at L4-5.  At L5-S1 there is a tiny disc protrusion which does not cause any significant central canal stenosis or foraminal stenosis.  He does have mild to moderate foraminal stenosis regardless of both L4-5 and L5-S1.  CT scan does not show any evidence of pars defects.  On flexion-extension films the spinal listhesis is more easily visualized with a few millimeter increase.    Subjective:       History of Present Illness  Devin Kelly is a 76 y.o. male.    He is here for follow-up.  He is here to discuss surgery.  His history is as follows:     Devin Kelly is a 76 y.o. male visit of history of prostate cancer approximate 4-5 years ago status post prostatectomy, diabetes, hypertension who presents with 6 months of bilateral posterior thigh and leg pain.  He states that there were no inciting events that caused the pain.  He states that the pain is nonradiating and intermittent.  He denies any significant weakness, numbness or tingling.  He has tried 2 epidural steroids with minimal relief.  He has not tried physical therapy.  He is otherwise ambulating without any difficulties.  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  ???  There is a very distant history of smoking.  After his prostatectomy he developed urinary incontinence for which a urinary reservoir was placed by urology.  He therefore urology to change the settings of the reservoir if he needs any kind of indwelling catheter.    Since I last saw him his symptoms persisted.  Predominantly right worse than left posterior leg pain in the calf region.  It is worse in the morning as he gets out of bed and starts ambulating.  No numbness weakness.    Past Medical History:   Past Medical History:   Diagnosis Date   ??? Diabetes mellitus, type II (HCC)    ??? Dyslipidemia ??? ED (erectile dysfunction)     Baseline ED prior to prostatectomy.  Post-prostatectomy ED.   ??? Esophageal stricture    ??? GERD (gastroesophageal reflux disease)    ??? Hypertension    ??? Prostate cancer (HCC) 08/10/2011   ??? SUI (stress urinary incontinence), male     Post-prostatectomy SUI       Past Surgical History:   Past Surgical History:   Procedure Laterality Date   ??? RADICAL PROSTATECTOMY  09/25/2011    Non-Nerve Sparing RALP; Dr. Laveda Norman   ??? INCONTINENCE SURGERY  07/07/2013    Artificial Urinary Sphincter (AUS) placement; Dr. Larita Fife   ??? PR CARPECTOMY ALL BONES PROXIMAL ROW Left 08/04/2014    LEFT WRIST PROXIMAL ROW CARPECTOMY performed by Desiree Hane, MD at [facility]   ??? DENTAL SURGERY     ??? ESOPHAGEAL DILATATION      x4   ??? HX FEMUR FRACTURE TX     ??? HX JOINT REPLACEMENT      bilateral knee        Family History: family history includes Asthma in his brother and sister; Cancer in his maternal uncle; Diabetes in his maternal aunt and another family member.    Social History:   Social History     Social History   ??? Marital status: Married     Spouse name: N/A   ???  Number of children: N/A   ??? Years of education: N/A     Occupational History   ??? Not on file.     Social History Main Topics   ??? Smoking status: Former Smoker     Packs/day: 1.00     Years: 20.00     Types: Cigarettes     Quit date: 12/19/1991   ??? Smokeless tobacco: Never Used   ??? Alcohol use No      Comment: Quit 1996   ??? Drug use: No   ??? Sexual activity: Not Currently     Partners: Female     Other Topics Concern   ??? Not on file     Social History Narrative   ??? No narrative on file       Allergies:   Allergies   Allergen Reactions   ??? Crestor [Rosuvastatin] MUSCLE PAIN                        Review of Systems   Constitutional: Negative.    HENT: Negative.    Eyes: Negative.    Respiratory: Negative.    Cardiovascular: Negative.    Gastrointestinal: Negative.    Endocrine: Negative.    Genitourinary: Positive for frequency. Musculoskeletal: Positive for back pain and myalgias.   Skin: Negative.    Allergic/Immunologic: Negative.    Neurological: Negative.    Hematological: Negative.    Psychiatric/Behavioral: Negative.          Objective:         ??? aspirin-caffeine 500-32.5 mg tab Take 2 Tabs by mouth twice daily.   ??? cyanocobalamin(+) (VITAMIN B-12) 100 mcg tablet Take 100 mcg by mouth twice daily.   ??? enalapril/hydrochlorothiazide (VASERETIC) 10/25 mg tablet Take 1 Tab by mouth at bedtime daily.   ??? fenofibrate nanocrystallized (TRICOR) 145 mg tablet Take 145 mg by mouth daily.   ??? fish oil /omega-3 fatty acids (SEA-OMEGA) 340/1000 mg capsule Take 4 Caps by mouth daily.   ??? glipiZIDE CR (GLUCOTROL XL) 5 mg tablet Take 5 mg by mouth daily.   ??? HYDROcodone/acetaminophen (NORCO; VICODIN) 5-325 mg tablet Take 1 Tab by mouth every 6 hours as needed for Pain   ??? metFORMIN (GLUCOPHAGE) 1,000 mg tablet Take 1,000 mg by mouth twice daily with meals.     ??? MULTIVITAMIN PO Take 1 Tab by mouth at bedtime daily.   ??? omeprazole DR(+) (PRILOSEC) 20 mg capsule Take 20 mg by mouth at bedtime daily.     Vitals:    01/10/17 0722   BP: 138/61   Pulse: 63   SpO2: 98%   Weight: 102.5 kg (226 lb)   Height: 180.3 cm (70.98)     Body mass index is 31.53 kg/m???.     Physical Exam  On examination today in clinic, he is a pleasant gentleman, who appears his stated age. No acute distress.     Cardiovascular examination: Normal S1, S2. No murmurs.     Respiratory exam: Good air entry bilaterally. No crackles or wheezes.     On neurological assessment, cranial nerves are grossly normal. On motor testing, normal bulk and tone throughout with 5/5 power throughout the upper and lower extremities bilaterally. Reflexes are 1+ and symmetrical throughout. There is no clonus. Hoffmann's is negative.     On examination of spine, no palpable tenderness. No SI joint tenderness.     [place] today did review imaging taken of his lumbar spine.  On MRI  imaging he has evidence of subtle listhesis at L4-5 with associated moderate to severe central canal and lateral recess stenosis.  L5-S1 there is a tiny disc protrusion that does not cause any significant neural impingement.  He does have bilateral mild to moderate foraminal stenosis at both L4-5 and L5-S1.  CT scan does not reveal any evidence of pars defect.  On flexion extension films there is a few millimeters of movement at the L4-5 level.  Assessment and Plan:    We discussed surgical options again in clinic today.  We previously discussed the same.  I suspect his symptoms are localized to the L4-5 region.  One option would be a smaller MIS laminectomy to decompress the central canal lateral recesses.  The other option would be a fusion to that area.  Most cases a smaller option does suffice although sometimes it does not.  I quoted him previously roughly a 20% chance of failure.  He understands that.  Still he rather proceed with a small surgery knowing that we may ultimately have to return for a bigger one consisting of fusion.  We will proceed with a minimally invasive right-sided L4-5 laminectomy.

## 2017-01-11 ENCOUNTER — Ambulatory Visit: Admit: 2017-01-10 | Discharge: 2017-01-11 | Payer: MEDICARE

## 2017-01-11 DIAGNOSIS — M48061 Spinal stenosis, lumbar region without neurogenic claudication: ICD-10-CM

## 2017-01-11 DIAGNOSIS — M4316 Spondylolisthesis, lumbar region: ICD-10-CM

## 2017-01-11 DIAGNOSIS — Z01810 Encounter for preprocedural cardiovascular examination: Principal | ICD-10-CM

## 2017-01-11 DIAGNOSIS — M48062 Spinal stenosis, lumbar region with neurogenic claudication: ICD-10-CM

## 2017-01-23 ENCOUNTER — Encounter: Admit: 2017-01-23 | Discharge: 2017-01-23 | Payer: MEDICARE

## 2017-01-23 ENCOUNTER — Ambulatory Visit: Admit: 2017-01-23 | Discharge: 2017-01-23 | Payer: MEDICARE

## 2017-01-23 DIAGNOSIS — K219 Gastro-esophageal reflux disease without esophagitis: ICD-10-CM

## 2017-01-23 DIAGNOSIS — Z87891 Personal history of nicotine dependence: ICD-10-CM

## 2017-01-23 DIAGNOSIS — N529 Male erectile dysfunction, unspecified: ICD-10-CM

## 2017-01-23 DIAGNOSIS — C61 Malignant neoplasm of prostate: Principal | ICD-10-CM

## 2017-01-23 DIAGNOSIS — E119 Type 2 diabetes mellitus without complications: ICD-10-CM

## 2017-01-23 DIAGNOSIS — Z9079 Acquired absence of other genital organ(s): ICD-10-CM

## 2017-01-23 DIAGNOSIS — E785 Hyperlipidemia, unspecified: ICD-10-CM

## 2017-01-23 DIAGNOSIS — Z7984 Long term (current) use of oral hypoglycemic drugs: ICD-10-CM

## 2017-01-23 DIAGNOSIS — K222 Esophageal obstruction: ICD-10-CM

## 2017-01-23 DIAGNOSIS — I10 Essential (primary) hypertension: ICD-10-CM

## 2017-01-23 DIAGNOSIS — Z8546 Personal history of malignant neoplasm of prostate: ICD-10-CM

## 2017-01-23 DIAGNOSIS — M48062 Spinal stenosis, lumbar region with neurogenic claudication: Principal | ICD-10-CM

## 2017-01-23 DIAGNOSIS — M1990 Unspecified osteoarthritis, unspecified site: ICD-10-CM

## 2017-01-23 DIAGNOSIS — N393 Stress incontinence (female) (male): ICD-10-CM

## 2017-01-23 LAB — POC GLUCOSE
Lab: 103 mg/dL — ABNORMAL HIGH (ref 70–100)
Lab: 132 mg/dL — ABNORMAL HIGH (ref 70–100)

## 2017-01-23 MED ORDER — METOCLOPRAMIDE HCL 5 MG/ML IJ SOLN
10 mg | Freq: Once | INTRAVENOUS | 0 refills | Status: DC | PRN
Start: 2017-01-23 — End: 2017-01-24

## 2017-01-23 MED ORDER — PROMETHAZINE 25 MG/ML IJ SOLN
6.25 mg | INTRAVENOUS | 0 refills | Status: DC | PRN
Start: 2017-01-23 — End: 2017-01-24

## 2017-01-23 MED ORDER — MIDAZOLAM 1 MG/ML IJ SOLN
INTRAVENOUS | 0 refills | Status: DC
Start: 2017-01-23 — End: 2017-01-23
  Administered 2017-01-23: 19:00:00 1 mg via INTRAVENOUS

## 2017-01-23 MED ORDER — BUPIVACAINE-EPINEPHRINE 0.25 %-1:200,000 IJ SOLN
0 refills | Status: DC
Start: 2017-01-23 — End: 2017-01-24
  Administered 2017-01-23: 20:00:00 5 mL via INTRAMUSCULAR
  Administered 2017-01-23: 22:00:00 4 mL via INTRAMUSCULAR

## 2017-01-23 MED ORDER — BACITRACIN 50,000 UN NS 500 ML IRR BOT (OR)
0 refills | Status: DC
Start: 2017-01-23 — End: 2017-01-24
  Administered 2017-01-23 (×2): 500 mL

## 2017-01-23 MED ORDER — PROPOFOL INJ 10 MG/ML IV VIAL
0 refills | Status: DC
Start: 2017-01-23 — End: 2017-01-23
  Administered 2017-01-23: 19:00:00 200 mg via INTRAVENOUS

## 2017-01-23 MED ORDER — HYDROMORPHONE (PF) 2 MG/ML IJ SYRG
.5-1 mg | INTRAVENOUS | 0 refills | Status: DC | PRN
Start: 2017-01-23 — End: 2017-01-24

## 2017-01-23 MED ORDER — SODIUM CHLORIDE 0.9 % IV SOLP
INTRAVENOUS | 0 refills | Status: DC
Start: 2017-01-23 — End: 2017-01-24

## 2017-01-23 MED ORDER — DEXAMETHASONE SODIUM PHOSPHATE 4 MG/ML IJ SOLN
INTRAVENOUS | 0 refills | Status: DC
Start: 2017-01-23 — End: 2017-01-23
  Administered 2017-01-23: 20:00:00 4 mg via INTRAVENOUS

## 2017-01-23 MED ORDER — GABAPENTIN 300 MG PO CAP
300 mg | Freq: Once | ORAL | 0 refills | Status: CP
Start: 2017-01-23 — End: ?
  Administered 2017-01-23: 17:00:00 300 mg via ORAL

## 2017-01-23 MED ORDER — LIDOCAINE (PF) 10 MG/ML (1 %) IJ SOLN
.1-2 mL | INTRAMUSCULAR | 0 refills | Status: DC | PRN
Start: 2017-01-23 — End: 2017-01-24

## 2017-01-23 MED ORDER — FENTANYL CITRATE (PF) 50 MCG/ML IJ SOLN
50 ug | INTRAVENOUS | 0 refills | Status: DC | PRN
Start: 2017-01-23 — End: 2017-01-24

## 2017-01-23 MED ORDER — CEFAZOLIN 1 GRAM IJ SOLR
0 refills | Status: DC
Start: 2017-01-23 — End: 2017-01-23
  Administered 2017-01-23: 20:00:00 2 g via INTRAVENOUS

## 2017-01-23 MED ORDER — SUGAMMADEX 100 MG/ML IV SOLN
INTRAVENOUS | 0 refills | Status: DC
Start: 2017-01-23 — End: 2017-01-23
  Administered 2017-01-23: 22:00:00 194 mg via INTRAVENOUS

## 2017-01-23 MED ORDER — ROCURONIUM 10 MG/ML IV SOLN
INTRAVENOUS | 0 refills | Status: DC
Start: 2017-01-23 — End: 2017-01-23
  Administered 2017-01-23: 21:00:00 10 mg via INTRAVENOUS
  Administered 2017-01-23: 19:00:00 50 mg via INTRAVENOUS
  Administered 2017-01-23 (×2): 10 mg via INTRAVENOUS

## 2017-01-23 MED ORDER — FENTANYL CITRATE (PF) 50 MCG/ML IJ SOLN
25 ug | INTRAVENOUS | 0 refills | Status: DC | PRN
Start: 2017-01-23 — End: 2017-01-24

## 2017-01-23 MED ORDER — HYDROCODONE-ACETAMINOPHEN 5-325 MG PO TAB
1 | ORAL_TABLET | ORAL | 0 refills | 15.00000 days | Status: AC | PRN
Start: 2017-01-23 — End: 2017-02-08

## 2017-01-23 MED ORDER — FENTANYL CITRATE (PF) 50 MCG/ML IJ SOLN
0 refills | Status: DC
Start: 2017-01-23 — End: 2017-01-23
  Administered 2017-01-23: 19:00:00 100 ug via INTRAVENOUS

## 2017-01-23 MED ORDER — LIDOCAINE (PF) 200 MG/10 ML (2 %) IJ SYRG
0 refills | Status: DC
Start: 2017-01-23 — End: 2017-01-23
  Administered 2017-01-23: 19:00:00 100 mg via INTRAVENOUS

## 2017-01-23 MED ORDER — DEXTRAN 70-HYPROMELLOSE (PF) 0.1-0.3 % OP DPET
0 refills | Status: DC
Start: 2017-01-23 — End: 2017-01-23
  Administered 2017-01-23: 19:00:00 2 [drp] via OPHTHALMIC

## 2017-01-23 MED ORDER — DIPHENHYDRAMINE HCL 50 MG/ML IJ SOLN
25 mg | Freq: Once | INTRAVENOUS | 0 refills | Status: DC | PRN
Start: 2017-01-23 — End: 2017-01-24

## 2017-01-23 MED ORDER — SCOPOLAMINE BASE 1 MG OVER 3 DAYS TD PT3D
1 | Freq: Once | TRANSDERMAL | 0 refills | Status: DC
Start: 2017-01-23 — End: 2017-01-24
  Administered 2017-01-23: 17:00:00 1 via TRANSDERMAL

## 2017-01-23 MED ORDER — THROMBIN (BOVINE) 5,000 UNIT TP SOLR
0 refills | Status: DC
Start: 2017-01-23 — End: 2017-01-24
  Administered 2017-01-23: 19:00:00 5000 [IU] via TOPICAL

## 2017-01-23 MED ORDER — METHOCARBAMOL 750 MG PO TAB
750 mg | ORAL_TABLET | Freq: Three times a day (TID) | ORAL | 0 refills | Status: AC | PRN
Start: 2017-01-23 — End: 2017-02-08

## 2017-01-23 MED ORDER — KETAMINE 10 MG/ML IJ SOLN
0 refills | Status: DC
Start: 2017-01-23 — End: 2017-01-23
  Administered 2017-01-23: 19:00:00 30 mg via INTRAVENOUS

## 2017-01-23 MED ORDER — ACETAMINOPHEN 500 MG PO TAB
1000 mg | Freq: Once | ORAL | 0 refills | Status: CP
Start: 2017-01-23 — End: ?
  Administered 2017-01-23: 17:00:00 1000 mg via ORAL

## 2017-01-23 MED ADMIN — SODIUM CHLORIDE 0.9 % IV SOLP [27838]: INTRAVENOUS | @ 15:00:00 | Stop: 2017-01-23 | NDC 00338004904

## 2017-01-23 NOTE — Other
Brief Operative Note    Name: MAXIMIANO LOTT is a 76 y.o. 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     Specimen(s) Removed/Disposition: * No specimens in log *    Complications:  None    Implants: None    Drains: None    Disposition:  PACU - stable    Elisha Ponder, MD  Pager [phone]

## 2017-01-23 NOTE — Operative Report(Direct Entry)
OPERATIVE REPORT    Name: Devin Kelly is a 76 y.o. 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  Imaging findings revealed the presence of severe spinal stenosis L4-5 with associated subtle small listhesis.  We discussed different options and ultimately decided upon a minimally invasive laminectomy for decompression radiation of symptoms.  Informed consent was obtained preoperatively.    Procedure note    Patient brought to the operating room.  There is induced under general anesthesia and intubated.  He is positioned prone on the Wilson frame.  Entire region over his lumbosacral spine was prepped and draped in the usual fashion.    Under lateral fluoroscopy we targeted the L4-5 interspace on the right side his more symptomatic side.  A 2 half centimeter incision was made approximately 2 cm to the right of midline after infiltrating 1.25% Marcaine containing epinephrine.  Skin was incised using 10 blade scalpel.  Monopolar cautery continued dissection down through subcutaneous tissue and fascia.  We then introduced the diffuse spotlight system dilating set.  Under lateral fluoroscopy we dilated up to a depth of 60 mm and a width of 21 mm.  With good localization we then inspected the space underneath. We began dissecting soft tissues overriding bony surface.  Ultimately we identified this as the facet joint.  We are indeed to lateral.  The tube system was brought out and I then redirected it more medially.  A second fascial incision was made.  The diffuse spotlight system was directed more medially.  He appeared to have extremely large facet joints that made the trajectory somewhat challenging.  I continue dilating up to a depth of 60 mm and a width of 21.  With good lateral localization and dissected the residual muscle of the spinous process and lamina of L4 inferiorly and L5 superiorly.    Next the operating room microscope was brought into the field.  Under the microscope I began filling out the inferior half of the lamina and spinous process at L4.  Across the midline and began drilling the inferior aspect of the lamina on the contralateral side.  Ligamentum flavum was identified underneath.  I then identified the top third of the lamina of L5 into the same by thinning it out crossing midline in the setting of the lamina on the contralateral side.    Next release ligamentum flavum using an upward angled curettes.  Then began resecting it in a piecemeal fashion.  I began working circumferentially around dissecting the ligamentum flavum as I went small undercutting bone on all sides.  On the right side at the L5 region there  is a fair amount of adhesions and scar tissue therapy tethering the dura.  I was unable to completely remove it safely.  It was not compressive.    Due to the angle I had some difficulty with the ipsilateral right side.  Curette was used to work underneath the lamina and release ligamentum flavum further.  I continued until is no further evidence of progression that area.  Woodson dissector, easily circumferentially around the entire exposed area.  The thecal sac was no longer compressed.    Satisfied with decompression turned towards closure.  The wound was irrigated under copious amounts normal saline.  FloSeal was placed in the epidural space.  The diffuse spotlight system was taken out as was the operating room microscope.    The wound was closed in layers.  2-0 Vicryl sutures approximate the fascia 3-0 Vicryl the subcutaneous tissue and 3-0 Monocryl in a running fashion for skin with Dermabond over top.    All counts were correct at the end of the case.  No complications.  Estimated blood loss was 50 cc.    At the end of the case the patient was returned to the supine position.  He was reversed with anesthesia and extubated.  He was transferred on to the PACU      Estimated Blood Loss:  No blood loss documented.         Attestation: I performed this procedure with a resident.    Storm Frisk, MD  Pager

## 2017-01-23 NOTE — Anesthesia Post-Procedure Evaluation
Post-Anesthesia Evaluation    Name: Devin Kelly      MRN: [id number]     DOB: [birth year]     Age: 76 y.o.     Sex: male   __________________________________________________________________________     Procedure Date: 01/23/2017  Procedure: Procedure(s) with comments:  LAMINECTOMY/ FACETECTOMY/ FORAMINOTOMY WITH DECOMPRESSION - 1 VERTEBRAL SEGMENT - LUMBAR 4-5 - MINIMALLY INVASIVE - CASE LENGTH 2 HOURS, WILSON FRAME, MICROSCOPE, DEPUY SPOTLIGHT  MICROSURGICAL TECHNIQUES - REQUIRING OPERATING MICROSCOPE USE      Surgeon: Surgeon(s):  Sami, Dennard Schaumann, MD  Bronwen Betters, MD    Post-Anesthesia Vitals  BP: 147/74 (10/02 1730)  Temp: 37.1 C (98.8 F) (10/02 1711)  Pulse: 70 (10/02 1730)  Respirations: 22 PER MINUTE (10/02 1730)  SpO2: 96 % (10/02 1711)  SpO2 Pulse: 87 (10/02 1711)      Post Anesthesia Evaluation Note    Evaluation location: Pre/Post  Patient participation: recovered; patient participated in evaluation  Level of consciousness: alert    Pain score: 0  Pain management: adequate    Hydration: normovolemia  Temperature: 36.0C - 38.4C  Airway patency: adequate    Perioperative Events  Perioperative events:  no       Post-op nausea and vomiting: nausea    Postoperative Status  Cardiovascular status: hemodynamically stable  Respiratory status: spontaneous ventilation        Perioperative Events

## 2017-01-24 ENCOUNTER — Encounter: Admit: 2017-01-24 | Discharge: 2017-01-24 | Payer: MEDICARE

## 2017-01-24 NOTE — Telephone Encounter
Post-op call made to patient. Devin Kelly states his pain level is a 1 and is located in his right leg below his knee. He states his back is sore. He is taking pain medication as directed. He expressed how pleased he is with [facility] and the care he has received.  Dr. Leonie Man contact information was reviewed.  Pt has no complaints or questions at this time.

## 2017-01-25 ENCOUNTER — Encounter: Admit: 2017-01-25 | Discharge: 2017-01-25 | Payer: MEDICARE

## 2017-01-25 DIAGNOSIS — K222 Esophageal obstruction: ICD-10-CM

## 2017-01-25 DIAGNOSIS — I10 Essential (primary) hypertension: ICD-10-CM

## 2017-01-25 DIAGNOSIS — M1990 Unspecified osteoarthritis, unspecified site: ICD-10-CM

## 2017-01-25 DIAGNOSIS — K219 Gastro-esophageal reflux disease without esophagitis: ICD-10-CM

## 2017-01-25 DIAGNOSIS — E119 Type 2 diabetes mellitus without complications: ICD-10-CM

## 2017-01-25 DIAGNOSIS — E785 Hyperlipidemia, unspecified: ICD-10-CM

## 2017-01-25 DIAGNOSIS — N393 Stress incontinence (female) (male): ICD-10-CM

## 2017-01-25 DIAGNOSIS — C61 Malignant neoplasm of prostate: Principal | ICD-10-CM

## 2017-01-25 DIAGNOSIS — N529 Male erectile dysfunction, unspecified: ICD-10-CM

## 2017-02-08 ENCOUNTER — Encounter: Admit: 2017-02-08 | Discharge: 2017-02-08 | Payer: MEDICARE

## 2017-02-08 ENCOUNTER — Ambulatory Visit: Admit: 2017-02-08 | Discharge: 2017-02-09 | Payer: MEDICARE

## 2017-02-08 DIAGNOSIS — N393 Stress incontinence (female) (male): ICD-10-CM

## 2017-02-08 DIAGNOSIS — E785 Hyperlipidemia, unspecified: ICD-10-CM

## 2017-02-08 DIAGNOSIS — C61 Malignant neoplasm of prostate: Principal | ICD-10-CM

## 2017-02-08 DIAGNOSIS — Z5189 Encounter for other specified aftercare: Principal | ICD-10-CM

## 2017-02-08 DIAGNOSIS — K222 Esophageal obstruction: ICD-10-CM

## 2017-02-08 DIAGNOSIS — I10 Essential (primary) hypertension: ICD-10-CM

## 2017-02-08 DIAGNOSIS — N529 Male erectile dysfunction, unspecified: ICD-10-CM

## 2017-02-08 DIAGNOSIS — K219 Gastro-esophageal reflux disease without esophagitis: ICD-10-CM

## 2017-02-08 DIAGNOSIS — M1990 Unspecified osteoarthritis, unspecified site: ICD-10-CM

## 2017-02-08 DIAGNOSIS — E119 Type 2 diabetes mellitus without complications: ICD-10-CM

## 2017-02-08 MED ORDER — METHYLPREDNISOLONE 4 MG PO DSPK
ORAL_TABLET | 0 refills | Status: AC
Start: 2017-02-08 — End: 2017-03-07
  Filled 2017-02-08 (×2): qty 21, 6d supply, fill #1

## 2017-02-08 MED ORDER — METHOCARBAMOL 750 MG PO TAB
750 mg | ORAL_TABLET | Freq: Three times a day (TID) | ORAL | 0 refills | Status: AC | PRN
Start: 2017-02-08 — End: 2017-05-23
  Filled 2017-02-08 (×2): qty 60, 20d supply, fill #1

## 2017-02-08 MED ORDER — HYDROCODONE-ACETAMINOPHEN 5-325 MG PO TAB
1-2 | ORAL_TABLET | ORAL | 0 refills | 30.00000 days | Status: AC | PRN
Start: 2017-02-08 — End: 2017-05-23
  Filled 2017-02-08 (×2): qty 80, 7d supply, fill #1

## 2017-02-08 NOTE — Progress Notes
Subjective:     Devin Kelly returns today for 2 wk f/u.     History of Present Illness  Devin Kelly is a 76 y.o. male who returns today for 2 wk f/u s/p  LAMINECTOMY/ FACETECTOMY/ FORAMINOTOMY WITH DECOMPRESSION - 1 VERTEBRAL SEGMENT - LUMBAR 4-5 - MINIMALLY INVASIVE on 01/23/17 with Dr Burman Riis.  H reports his bilateral lower extremity pain was better right after surgery, but a few days post surgery it has worsened.  He reports BLE pain is much worse than before surgery.  RT is still worse than LT.  He reports he can sit and be fairly comfortable, but the pain increases significantly when he ambulates.  He must use a rolling walker when he ambulates due to the pain.  He insurance company would only give him 7 days of hydrocodone, so he needs a refill today.  He would also like a refill of methocarbamol.  He reports he is eating/drinking well.  Denies any constipation.  He denies any redness/drainage/swelling of his incision.    He states, I knew that the small surgery might not fix my problem.      Review of Systems   Constitutional: Negative.    HENT: Negative.    Eyes: Negative.    Respiratory: Negative.    Cardiovascular: Negative.    Gastrointestinal: Negative.    Endocrine: Negative.    Genitourinary: Negative.    Musculoskeletal:        Worsening bilateral lower extremity pain when ambulating.    Skin: Negative.    Allergic/Immunologic: Negative.    Neurological: Negative.    Hematological: Negative.    Psychiatric/Behavioral: Negative.            Objective:         ??? cyanocobalamin(+) (VITAMIN B-12) 100 mcg tablet Take 100 mcg by mouth twice daily.   ??? enalapril/hydrochlorothiazide (VASERETIC) 10/25 mg tablet Take 1 Tab by mouth at bedtime daily.   ??? fenofibrate(+) (TRIGLIDE) 160 mg tablet Take 160 mg by mouth daily. Take with food.   ??? fish oil /omega-3 fatty acids (SEA-OMEGA) 340/1000 mg capsule Take 4 Caps by mouth daily.   ??? glipiZIDE CR (GLUCOTROL XL) 5 mg tablet Take 5 mg by mouth daily. ??? HYDROcodone/acetaminophen (NORCO) 5/325 mg tablet Take one tablet to two tablets by mouth every 4 hours as needed for Pain   ??? metFORMIN (GLUCOPHAGE) 1,000 mg tablet Take 1,000 mg by mouth twice daily with meals.     ??? methocarbamol (ROBAXIN-750) 750 mg tablet Take one tablet by mouth three times daily as needed for Spasms.   ??? methylPREDNIsolone (MEDROL DOSEPAK) 4 mg tablet Take medication as directed on package for 6 days. Take with food.   ??? omeprazole DR(+) (PRILOSEC) 20 mg capsule Take 20 mg by mouth at bedtime daily.   ??? vitamins, multiple tablet Take 1 tablet by mouth at bedtime daily.     Vitals:    02/08/17 1137   BP: 136/71   Pulse: 65   Temp: 37.1 ???C (98.7 ???F)   TempSrc: Oral     There is no height or weight on file to calculate BMI.     Physical Exam       Alert/Fully Oriented. Affect pleasant.  Motor/Strength 5/5 Gait Steady with a rolling walker.  Incision edges well approximated.  No S/S of infection noted.     Assessment and Plan:     Devin Kelly returns today for 2 wk f/u.  He reports his bilateral lower  extremity pain is much worse than before surgery.  He's very concerned he may need a fusion.  We discussed his post operative increase in pain.  I explained that this could be the result of his small listhesis or this could be some nerve that are agitated due to his surgical procedure.  He voices understanding.  I will refill his pain medication and muscle relaxants.  I will also prescribe a course of steroids to see if we can get his pain to settle down and make him more comfortable.  He agrees and voices understanding of the above plan.  He will call us as the beginning of the week for a pain update.

## 2017-02-08 NOTE — Progress Notes
Devin Kelly come to the clinic for his 2wk POV  He underwent LAMINECTOMY/ FACETECTOMY/ FORAMINOTOMY WITH DECOMPRESSION - 1 VERTEBRAL SEGMENT - LUMBAR 4-5 - MINIMALLY INVASIVE on 01/23/17 with Dr Devin Kelly  Incision is C,D,I with no s/s of infection  Afebrile  He reports his pain has gotten worse in bilateral lower extremities.  He reports his pain has gotten worse

## 2017-02-12 ENCOUNTER — Encounter: Admit: 2017-02-12 | Discharge: 2017-02-12 | Payer: MEDICARE

## 2017-02-12 NOTE — Telephone Encounter
Patient called to update Dr. Burton Apley on his pain. Patient pain is doing a lot better since he got a refill on medication. Patient will call the office if he needs anything else from [place]. Patient does have out office number.

## 2017-03-07 ENCOUNTER — Encounter: Admit: 2017-03-07 | Discharge: 2017-03-07 | Payer: MEDICARE

## 2017-03-07 ENCOUNTER — Ambulatory Visit: Admit: 2017-03-07 | Discharge: 2017-03-07 | Payer: MEDICARE

## 2017-03-07 DIAGNOSIS — M48061 Spinal stenosis, lumbar region without neurogenic claudication: Principal | ICD-10-CM

## 2017-03-07 DIAGNOSIS — E785 Hyperlipidemia, unspecified: ICD-10-CM

## 2017-03-07 DIAGNOSIS — N393 Stress incontinence (female) (male): ICD-10-CM

## 2017-03-07 DIAGNOSIS — K219 Gastro-esophageal reflux disease without esophagitis: ICD-10-CM

## 2017-03-07 DIAGNOSIS — C61 Malignant neoplasm of prostate: Principal | ICD-10-CM

## 2017-03-07 DIAGNOSIS — E119 Type 2 diabetes mellitus without complications: ICD-10-CM

## 2017-03-07 DIAGNOSIS — N529 Male erectile dysfunction, unspecified: ICD-10-CM

## 2017-03-07 DIAGNOSIS — K222 Esophageal obstruction: ICD-10-CM

## 2017-03-07 DIAGNOSIS — M1990 Unspecified osteoarthritis, unspecified site: ICD-10-CM

## 2017-03-07 DIAGNOSIS — I10 Essential (primary) hypertension: ICD-10-CM

## 2017-03-07 MED ORDER — METHYLPREDNISOLONE 4 MG PO DSPK
ORAL_TABLET | 0 refills | Status: AC
Start: 2017-03-07 — End: 2017-05-23

## 2017-03-07 NOTE — Progress Notes
Subjective:       History of Present Illness  Devin Kelly is a 76 y.o. male.    He is here for follow-up.  He is status post lumbar minimally invasive laminectomy at L4-5.  He states that initially after surgery he was doing well for several days and then began having recurrence of symptoms.  He was seen by Launa Grill my nurse practitioner who did order Medrol Dosepak.  It helped a fair but but the symptoms returned.  He says at this point his symptoms are just as bad if not worse than previous.       Review of Systems   Constitutional: Negative.    HENT: Negative.    Eyes: Negative.    Respiratory: Negative.    Cardiovascular: Negative.    Gastrointestinal: Negative.    Endocrine: Negative.    Genitourinary: Negative.    Musculoskeletal: Negative.    Skin: Negative.    Allergic/Immunologic: Negative.    Neurological: Negative.    Hematological: Negative.    Psychiatric/Behavioral: Negative.          Objective:         ??? cyanocobalamin(+) (VITAMIN B-12) 100 mcg tablet Take 100 mcg by mouth twice daily.   ??? enalapril/hydrochlorothiazide (VASERETIC) 10/25 mg tablet Take 1 Tab by mouth at bedtime daily.   ??? fenofibrate(+) (TRIGLIDE) 160 mg tablet Take 160 mg by mouth daily. Take with food.   ??? fish oil /omega-3 fatty acids (SEA-OMEGA) 340/1000 mg capsule Take 4 Caps by mouth daily.   ??? glipiZIDE CR (GLUCOTROL XL) 5 mg tablet Take 5 mg by mouth daily.   ??? HYDROcodone/acetaminophen (NORCO) 5/325 mg tablet Take one tablet to two tablets by mouth every 4 hours as needed for Pain   ??? metFORMIN (GLUCOPHAGE) 1,000 mg tablet Take 1,000 mg by mouth twice daily with meals.     ??? methocarbamol (ROBAXIN-750) 750 mg tablet Take one tablet by mouth three times daily as needed for Spasms.   ??? methylPREDNIsolone (MEDROL DOSEPAK) 4 mg tablet Take medication as directed on package for 6 days. Take with food.   ??? omeprazole DR(+) (PRILOSEC) 20 mg capsule Take 20 mg by mouth at bedtime daily. ??? vitamins, multiple tablet Take 1 tablet by mouth at bedtime daily.     Vitals:    03/07/17 1357   BP: (!) 131/101   Pulse: 68   SpO2: 100%   Weight: 93.9 kg (207 lb)   Height: 180.3 cm (70.98)     Body mass index is 28.88 kg/m???.     Physical Exam    On examination today in clinic he looks well.  He is neurologically intact.    He did undergo x-rays at today's visit.  There is no evidence of increased movement of his L4-5 level.     Assessment and Plan:  I think this patient may just be having a slow posture recovery.  I want remain hopeful.  We will give him another bit Medrol Dosepak and will see how he does.  If the improves, then we will hold off any further imaging however if he has recurrence of his symptoms then I will order repeat MRI scan with and without contrast of his lumbar spine will plan to follow-up in a few weeks time.

## 2017-05-02 ENCOUNTER — Ambulatory Visit: Admit: 2017-05-02 | Discharge: 2017-05-03 | Payer: MEDICARE

## 2017-05-02 ENCOUNTER — Encounter: Admit: 2017-05-02 | Discharge: 2017-05-02 | Payer: MEDICARE

## 2017-05-02 DIAGNOSIS — E119 Type 2 diabetes mellitus without complications: ICD-10-CM

## 2017-05-02 DIAGNOSIS — K222 Esophageal obstruction: ICD-10-CM

## 2017-05-02 DIAGNOSIS — K219 Gastro-esophageal reflux disease without esophagitis: ICD-10-CM

## 2017-05-02 DIAGNOSIS — C61 Malignant neoplasm of prostate: Principal | ICD-10-CM

## 2017-05-02 DIAGNOSIS — N529 Male erectile dysfunction, unspecified: ICD-10-CM

## 2017-05-02 DIAGNOSIS — N393 Stress incontinence (female) (male): ICD-10-CM

## 2017-05-02 DIAGNOSIS — M4316 Spondylolisthesis, lumbar region: Principal | ICD-10-CM

## 2017-05-02 DIAGNOSIS — M1990 Unspecified osteoarthritis, unspecified site: ICD-10-CM

## 2017-05-02 DIAGNOSIS — I10 Essential (primary) hypertension: ICD-10-CM

## 2017-05-02 DIAGNOSIS — E785 Hyperlipidemia, unspecified: ICD-10-CM

## 2017-05-08 ENCOUNTER — Ambulatory Visit: Admit: 2017-05-08 | Discharge: 2017-05-08 | Payer: MEDICARE

## 2017-05-08 DIAGNOSIS — M4316 Spondylolisthesis, lumbar region: Principal | ICD-10-CM

## 2017-05-08 LAB — POC CREATININE, RAD: Lab: 1 mg/dL (ref 0.4–1.24)

## 2017-05-08 MED ORDER — GADOBENATE DIMEGLUMINE 529 MG/ML (0.1MMOL/0.2ML) IV SOLN
19 mL | Freq: Once | INTRAVENOUS | 0 refills | Status: CP
Start: 2017-05-08 — End: ?
  Administered 2017-05-08: 20:00:00 19 mL via INTRAVENOUS

## 2017-05-18 ENCOUNTER — Encounter: Admit: 2017-05-18 | Discharge: 2017-05-18 | Payer: MEDICARE

## 2017-05-23 ENCOUNTER — Ambulatory Visit: Admit: 2017-05-23 | Discharge: 2017-05-24 | Payer: MEDICARE

## 2017-05-23 ENCOUNTER — Encounter: Admit: 2017-05-23 | Discharge: 2017-05-23 | Payer: MEDICARE

## 2017-05-23 ENCOUNTER — Ambulatory Visit: Admit: 2017-05-23 | Discharge: 2017-05-23 | Payer: MEDICARE

## 2017-05-23 DIAGNOSIS — K219 Gastro-esophageal reflux disease without esophagitis: ICD-10-CM

## 2017-05-23 DIAGNOSIS — M4316 Spondylolisthesis, lumbar region: ICD-10-CM

## 2017-05-23 DIAGNOSIS — N393 Stress incontinence (female) (male): ICD-10-CM

## 2017-05-23 DIAGNOSIS — I10 Essential (primary) hypertension: ICD-10-CM

## 2017-05-23 DIAGNOSIS — K222 Esophageal obstruction: ICD-10-CM

## 2017-05-23 DIAGNOSIS — C61 Malignant neoplasm of prostate: Principal | ICD-10-CM

## 2017-05-23 DIAGNOSIS — N529 Male erectile dysfunction, unspecified: ICD-10-CM

## 2017-05-23 DIAGNOSIS — M1990 Unspecified osteoarthritis, unspecified site: ICD-10-CM

## 2017-05-23 DIAGNOSIS — E785 Hyperlipidemia, unspecified: ICD-10-CM

## 2017-05-23 DIAGNOSIS — M5417 Radiculopathy, lumbosacral region: Secondary | ICD-10-CM

## 2017-05-23 DIAGNOSIS — E119 Type 2 diabetes mellitus without complications: ICD-10-CM

## 2017-05-23 LAB — BASIC METABOLIC PANEL
Lab: 0.9 mg/dL (ref 0.4–1.24)
Lab: 10 mg/dL (ref 8.5–10.6)
Lab: 10 pg (ref 3–12)
Lab: 103 MMOL/L (ref 98–110)
Lab: 106 mg/dL — ABNORMAL HIGH (ref 70–100)
Lab: 137 MMOL/L — ABNORMAL LOW (ref 137–147)
Lab: 18 mg/dL (ref 7–25)
Lab: 24 MMOL/L (ref 21–30)
Lab: 3.9 MMOL/L (ref 3.5–5.1)
Lab: >60 mL/min (ref >60)
Lab: >60 mL/min (ref >60)

## 2017-05-23 LAB — CBC: Lab: 5.5 10*3/uL (ref 4.5–11.0)

## 2017-05-24 DIAGNOSIS — Z01818 Encounter for other preprocedural examination: ICD-10-CM

## 2017-05-24 DIAGNOSIS — Z01810 Encounter for preprocedural cardiovascular examination: Principal | ICD-10-CM

## 2017-06-15 ENCOUNTER — Encounter: Admit: 2017-06-15 | Discharge: 2017-06-15 | Payer: MEDICARE

## 2017-06-15 ENCOUNTER — Inpatient Hospital Stay: Admit: 2017-06-15 | Discharge: 2017-06-15 | Payer: MEDICARE

## 2017-06-15 DIAGNOSIS — I10 Essential (primary) hypertension: ICD-10-CM

## 2017-06-15 DIAGNOSIS — K222 Esophageal obstruction: ICD-10-CM

## 2017-06-15 DIAGNOSIS — M1990 Unspecified osteoarthritis, unspecified site: ICD-10-CM

## 2017-06-15 DIAGNOSIS — N529 Male erectile dysfunction, unspecified: ICD-10-CM

## 2017-06-15 DIAGNOSIS — C61 Malignant neoplasm of prostate: Principal | ICD-10-CM

## 2017-06-15 DIAGNOSIS — N393 Stress incontinence (female) (male): ICD-10-CM

## 2017-06-15 DIAGNOSIS — K219 Gastro-esophageal reflux disease without esophagitis: ICD-10-CM

## 2017-06-15 DIAGNOSIS — M4316 Spondylolisthesis, lumbar region: ICD-10-CM

## 2017-06-15 DIAGNOSIS — E119 Type 2 diabetes mellitus without complications: ICD-10-CM

## 2017-06-15 DIAGNOSIS — E785 Hyperlipidemia, unspecified: ICD-10-CM

## 2017-06-15 LAB — POC GLUCOSE
Lab: 96 mg/dL (ref 70–100)
Lab: 83 mg/dL (ref 70–100)

## 2017-06-15 MED ORDER — LIDOCAINE (PF) 10 MG/ML (1 %) IJ SOLN
.1-2 mL | INTRAMUSCULAR | 0 refills | Status: DC | PRN
Start: 2017-06-15 — End: 2017-06-18

## 2017-06-15 MED ORDER — ACETAMINOPHEN 325 MG PO TAB
650 mg | ORAL | 0 refills | Status: DC
Start: 2017-06-15 — End: 2017-06-18
  Administered 2017-06-16 – 2017-06-17 (×9): 650 mg via ORAL

## 2017-06-15 MED ORDER — ENALAPRIL MALEATE 10 MG PO TAB
10 mg | Freq: Every evening | ORAL | 0 refills | Status: DC
Start: 2017-06-15 — End: 2017-06-18
  Administered 2017-06-16 – 2017-06-17 (×2): 10 mg via ORAL

## 2017-06-15 MED ORDER — ELECTROLYTE-A IV SOLP
0 refills | Status: DC
Start: 2017-06-15 — End: 2017-06-15
  Administered 2017-06-15: 17:00:00 via INTRAVENOUS

## 2017-06-15 MED ORDER — LIDOCAINE 5 % TP PTMD
1 | Freq: Every day | TOPICAL | 0 refills | Status: DC
Start: 2017-06-15 — End: 2017-06-18
  Administered 2017-06-16 – 2017-06-17 (×2): 1 via TOPICAL

## 2017-06-15 MED ORDER — EPHEDRINE SULFATE 50 MG/5ML SYR (10 MG/ML) (AN)(OSM)
0 refills | Status: DC
Start: 2017-06-15 — End: 2017-06-15
  Administered 2017-06-15 (×2): 10 mg via INTRAVENOUS

## 2017-06-15 MED ORDER — ONDANSETRON HCL (PF) 4 MG/2 ML IJ SOLN
INTRAVENOUS | 0 refills | Status: DC
Start: 2017-06-15 — End: 2017-06-15
  Administered 2017-06-15: 21:00:00 4 mg via INTRAVENOUS

## 2017-06-15 MED ORDER — FAMOTIDINE (PF) 20 MG/2 ML IV SOLN
0 refills | Status: DC
Start: 2017-06-15 — End: 2017-06-15
  Administered 2017-06-15: 16:00:00 20 mg via INTRAVENOUS

## 2017-06-15 MED ORDER — HALOPERIDOL LACTATE 5 MG/ML IJ SOLN
1 mg | Freq: Once | INTRAVENOUS | 0 refills | Status: DC | PRN
Start: 2017-06-15 — End: 2017-06-16

## 2017-06-15 MED ORDER — REMIFENTANYL 1000MCG IN NS 20ML (OR)
0 refills | Status: DC
Start: 2017-06-15 — End: 2017-06-15
  Administered 2017-06-15 (×2): 20.000 mL via INTRAVENOUS
  Administered 2017-06-15 (×2): .05 ug/kg/min via INTRAVENOUS

## 2017-06-15 MED ORDER — PROMETHAZINE 25 MG/ML IJ SOLN
6.25 mg | INTRAVENOUS | 0 refills | Status: DC | PRN
Start: 2017-06-15 — End: 2017-06-16

## 2017-06-15 MED ORDER — PANTOPRAZOLE 40 MG PO TBEC
40 mg | Freq: Every day | ORAL | 0 refills | Status: DC
Start: 2017-06-15 — End: 2017-06-18
  Administered 2017-06-16 – 2017-06-17 (×2): 40 mg via ORAL

## 2017-06-15 MED ORDER — CEFAZOLIN INJ 1GM IVP
2 g | INTRAVENOUS | 0 refills | Status: DC
Start: 2017-06-15 — End: 2017-06-16
  Administered 2017-06-16 (×3): 2 g via INTRAVENOUS

## 2017-06-15 MED ORDER — BUPIVACAINE-EPINEPHRINE 0.25 %-1:200,000 IJ SOLN
0 refills | Status: DC
Start: 2017-06-15 — End: 2017-06-15
  Administered 2017-06-15: 17:00:00 7 mL via INTRAMUSCULAR

## 2017-06-15 MED ORDER — DEXTRAN 70-HYPROMELLOSE (PF) 0.1-0.3 % OP DPET
0 refills | Status: DC
Start: 2017-06-15 — End: 2017-06-15
  Administered 2017-06-15: 16:00:00 2 [drp] via OPHTHALMIC

## 2017-06-15 MED ORDER — PHENYLEPHRINE IN 0.9% NACL(PF) 1 MG/10 ML (100 MCG/ML) IV SYRG
INTRAVENOUS | 0 refills | Status: DC
Start: 2017-06-15 — End: 2017-06-15
  Administered 2017-06-15 (×2): 100 ug via INTRAVENOUS

## 2017-06-15 MED ORDER — MEPERIDINE (PF) 25 MG/ML IJ SYRG
12.5 mg | INTRAVENOUS | 0 refills | Status: DC | PRN
Start: 2017-06-15 — End: 2017-06-16

## 2017-06-15 MED ORDER — BACITRACIN 50,000 UN NS 500 ML IRR BOT (OR)
0 refills | Status: DC
Start: 2017-06-15 — End: 2017-06-15
  Administered 2017-06-15 (×2): 500 mL

## 2017-06-15 MED ORDER — METOCLOPRAMIDE HCL 5 MG/ML IJ SOLN
10 mg | Freq: Once | INTRAVENOUS | 0 refills | Status: DC | PRN
Start: 2017-06-15 — End: 2017-06-16

## 2017-06-15 MED ORDER — OXYCODONE 5 MG PO TAB
5-10 mg | Freq: Once | ORAL | 0 refills | Status: CP | PRN
Start: 2017-06-15 — End: ?
  Administered 2017-06-15: 22:00:00 10 mg via ORAL

## 2017-06-15 MED ORDER — ACETAMINOPHEN 500 MG PO TAB
1000 mg | Freq: Once | ORAL | 0 refills | Status: CP
Start: 2017-06-15 — End: ?
  Administered 2017-06-15: 16:00:00 1000 mg via ORAL

## 2017-06-15 MED ORDER — METFORMIN 500 MG PO TAB
1000 mg | Freq: Two times a day (BID) | ORAL | 0 refills | Status: DC
Start: 2017-06-15 — End: 2017-06-18
  Administered 2017-06-16 – 2017-06-17 (×4): 1000 mg via ORAL

## 2017-06-15 MED ORDER — THROMBIN (BOVINE) 5,000 UNIT TP SOLR
0 refills | Status: DC
Start: 2017-06-15 — End: 2017-06-15
  Administered 2017-06-15: 18:00:00 5000 [IU] via TOPICAL

## 2017-06-15 MED ORDER — SENNOSIDES-DOCUSATE SODIUM 8.6-50 MG PO TAB
1 | Freq: Two times a day (BID) | ORAL | 0 refills | Status: DC
Start: 2017-06-15 — End: 2017-06-18
  Administered 2017-06-16 – 2017-06-17 (×3): 1 via ORAL

## 2017-06-15 MED ORDER — GLIPIZIDE 5 MG PO TR24
5 mg | Freq: Every day | ORAL | 0 refills | Status: DC
Start: 2017-06-15 — End: 2017-06-18
  Administered 2017-06-16 – 2017-06-17 (×3): 5 mg via ORAL

## 2017-06-15 MED ORDER — PROPOFOL 10 MG/ML IV EMUL (INFUSION)(AM)(OR)
0 refills | Status: DC
Start: 2017-06-15 — End: 2017-06-15
  Administered 2017-06-15: 18:00:00 100.000 mL via INTRAVENOUS
  Administered 2017-06-15: 16:00:00 125 ug/kg/min via INTRAVENOUS
  Administered 2017-06-15: 19:00:00 100.000 mL via INTRAVENOUS
  Administered 2017-06-15: 17:00:00 115 ug/kg/min via INTRAVENOUS

## 2017-06-15 MED ORDER — HYDROMORPHONE 2 MG/ML IJ SOLN
0 refills | Status: DC
Start: 2017-06-15 — End: 2017-06-15
  Administered 2017-06-15 (×2): .4 mg via INTRAVENOUS
  Administered 2017-06-15: 20:00:00 .2 mg via INTRAVENOUS

## 2017-06-15 MED ORDER — METHOCARBAMOL 500 MG PO TAB
500 mg | ORAL | 0 refills | Status: DC | PRN
Start: 2017-06-15 — End: 2017-06-18

## 2017-06-15 MED ORDER — SCOPOLAMINE BASE 1 MG OVER 3 DAYS TD PT3D
0 refills | Status: DC
Start: 2017-06-15 — End: 2017-06-15
  Administered 2017-06-15: 16:00:00 1 via TOPICAL

## 2017-06-15 MED ORDER — CEFAZOLIN 1 GRAM IJ SOLR
0 refills | Status: DC
Start: 2017-06-15 — End: 2017-06-15
  Administered 2017-06-15: 17:00:00 2 g via INTRAVENOUS

## 2017-06-15 MED ORDER — DOCUSATE SODIUM 100 MG PO CAP
100 mg | Freq: Two times a day (BID) | ORAL | 0 refills | Status: DC
Start: 2017-06-15 — End: 2017-06-18
  Administered 2017-06-16 – 2017-06-17 (×3): 100 mg via ORAL

## 2017-06-15 MED ORDER — FENOFIBRATE NANOCRYSTALLIZED 145 MG PO TAB
145 mg | Freq: Every day | ORAL | 0 refills | Status: DC
Start: 2017-06-15 — End: 2017-06-18
  Administered 2017-06-16 – 2017-06-17 (×2): 145 mg via ORAL

## 2017-06-15 MED ORDER — GLIPIZIDE 5 MG PO TR24
5 mg | Freq: Every day | ORAL | 0 refills | Status: DC
Start: 2017-06-15 — End: 2017-06-16

## 2017-06-15 MED ORDER — LIDOCAINE (PF) 200 MG/10 ML (2 %) IJ SYRG
0 refills | Status: DC
Start: 2017-06-15 — End: 2017-06-15
  Administered 2017-06-15: 16:00:00 100 mg via INTRAVENOUS

## 2017-06-15 MED ORDER — CEFAZOLIN INJ 1GM IVP
1 g | INTRAVENOUS | 0 refills | Status: DC
Start: 2017-06-15 — End: 2017-06-16

## 2017-06-15 MED ORDER — FENTANYL CITRATE (PF) 50 MCG/ML IJ SOLN
50 ug | INTRAVENOUS | 0 refills | Status: DC | PRN
Start: 2017-06-15 — End: 2017-06-16
  Administered 2017-06-15: 22:00:00 50 ug via INTRAVENOUS

## 2017-06-15 MED ORDER — PROPOFOL INJ 10 MG/ML IV VIAL
0 refills | Status: DC
Start: 2017-06-15 — End: 2017-06-15
  Administered 2017-06-15: 21:00:00 50 mg via INTRAVENOUS
  Administered 2017-06-15: 16:00:00 120 mg via INTRAVENOUS
  Administered 2017-06-15: 21:00:00 30 mg via INTRAVENOUS

## 2017-06-15 MED ORDER — OXYCODONE 5 MG PO TAB
5-10 mg | ORAL | 0 refills | Status: DC | PRN
Start: 2017-06-15 — End: 2017-06-18
  Administered 2017-06-16 – 2017-06-17 (×2): 10 mg via ORAL

## 2017-06-15 MED ORDER — HYDROMORPHONE (PF) 2 MG/ML IJ SYRG
1 mg | INTRAVENOUS | 0 refills | Status: DC | PRN
Start: 2017-06-15 — End: 2017-06-16
  Administered 2017-06-15: 22:00:00 1 mg via INTRAVENOUS

## 2017-06-15 MED ORDER — ONDANSETRON HCL (PF) 4 MG/2 ML IJ SOLN
4 mg | INTRAVENOUS | 0 refills | Status: DC | PRN
Start: 2017-06-15 — End: 2017-06-18

## 2017-06-15 MED ORDER — PHENYLEPHRINE IV DRIP (STD CONC)
0 refills | Status: DC
Start: 2017-06-15 — End: 2017-06-15
  Administered 2017-06-15 (×2): .3 ug/kg/min via INTRAVENOUS

## 2017-06-15 MED ORDER — BISACODYL 10 MG RE SUPP
10 mg | Freq: Every day | RECTAL | 0 refills | Status: DC | PRN
Start: 2017-06-15 — End: 2017-06-18
  Administered 2017-06-17: 02:00:00 10 mg via RECTAL

## 2017-06-15 MED ORDER — HYDROCHLOROTHIAZIDE 25 MG PO TAB
25 mg | Freq: Every evening | ORAL | 0 refills | Status: DC
Start: 2017-06-15 — End: 2017-06-18
  Administered 2017-06-16 – 2017-06-17 (×2): 25 mg via ORAL

## 2017-06-15 MED ORDER — FENTANYL CITRATE (PF) 50 MCG/ML IJ SOLN
25 ug | INTRAVENOUS | 0 refills | Status: DC | PRN
Start: 2017-06-15 — End: 2017-06-18

## 2017-06-15 MED ORDER — VANCOMYCIN 1,000 MG IV SOLR
0 refills | Status: DC
Start: 2017-06-15 — End: 2017-06-15
  Administered 2017-06-15: 20:00:00 1 g via TOPICAL

## 2017-06-15 MED ORDER — LACTATED RINGERS IV SOLP
1000 mL | INTRAVENOUS | 0 refills | Status: AC
Start: 2017-06-15 — End: ?
  Administered 2017-06-15: 21:00:00 1000.000 mL via INTRAVENOUS
  Administered 2017-06-15: 15:00:00 1000 mL via INTRAVENOUS

## 2017-06-15 MED ORDER — SUCCINYLCHOLINE CHLORIDE 20 MG/ML IJ SOLN
INTRAVENOUS | 0 refills | Status: DC
Start: 2017-06-15 — End: 2017-06-15
  Administered 2017-06-15: 16:00:00 100 mg via INTRAVENOUS

## 2017-06-15 MED ADMIN — FENTANYL CITRATE (PF) 50 MCG/ML IJ SOLN [3037]: 50 ug | INTRAVENOUS | @ 22:00:00 | Stop: 2017-06-15 | NDC 00409909412

## 2017-06-16 MED ORDER — POLYETHYLENE GLYCOL 3350 17 GRAM PO PWPK
1-2 | Freq: Two times a day (BID) | ORAL | 0 refills | Status: DC
Start: 2017-06-16 — End: 2017-06-18
  Administered 2017-06-16: 23:00:00 17 g via ORAL

## 2017-06-16 MED ORDER — POLYETHYLENE GLYCOL 3350 17 GRAM PO PWPK
1 | Freq: Two times a day (BID) | ORAL | 0 refills | Status: DC
Start: 2017-06-16 — End: 2017-06-16

## 2017-06-17 ENCOUNTER — Inpatient Hospital Stay: Admit: 2017-05-23 | Discharge: 2017-05-23 | Payer: MEDICARE

## 2017-06-17 ENCOUNTER — Inpatient Hospital Stay: Admit: 2017-06-15 | Discharge: 2017-06-15 | Payer: MEDICARE

## 2017-06-17 ENCOUNTER — Inpatient Hospital Stay: Admit: 2017-06-15 | Discharge: 2017-06-17 | Disposition: A | Discharge status: Home Health | Payer: MEDICARE

## 2017-06-17 DIAGNOSIS — K219 Gastro-esophageal reflux disease without esophagitis: ICD-10-CM

## 2017-06-17 DIAGNOSIS — M1990 Unspecified osteoarthritis, unspecified site: ICD-10-CM

## 2017-06-17 DIAGNOSIS — E785 Hyperlipidemia, unspecified: ICD-10-CM

## 2017-06-17 DIAGNOSIS — M79604 Pain in right leg: ICD-10-CM

## 2017-06-17 DIAGNOSIS — M4316 Spondylolisthesis, lumbar region: Principal | ICD-10-CM

## 2017-06-17 DIAGNOSIS — E119 Type 2 diabetes mellitus without complications: ICD-10-CM

## 2017-06-17 DIAGNOSIS — M5417 Radiculopathy, lumbosacral region: ICD-10-CM

## 2017-06-17 DIAGNOSIS — I10 Essential (primary) hypertension: ICD-10-CM

## 2017-06-17 DIAGNOSIS — Z8546 Personal history of malignant neoplasm of prostate: ICD-10-CM

## 2017-06-17 DIAGNOSIS — M48061 Spinal stenosis, lumbar region without neurogenic claudication: ICD-10-CM

## 2017-06-17 DIAGNOSIS — Z87891 Personal history of nicotine dependence: ICD-10-CM

## 2017-06-17 DIAGNOSIS — N5234 Erectile dysfunction following simple prostatectomy: ICD-10-CM

## 2017-06-17 DIAGNOSIS — Z96659 Presence of unspecified artificial knee joint: ICD-10-CM

## 2017-06-17 MED ORDER — ACETAMINOPHEN 325 MG PO TAB
650 mg | ORAL_TABLET | ORAL | 0 refills | Status: AC
Start: 2017-06-17 — End: ?

## 2017-06-17 MED ORDER — METHOCARBAMOL 500 MG PO TAB
500 mg | ORAL_TABLET | ORAL | 0 refills | Status: AC | PRN
Start: 2017-06-17 — End: 2017-06-29

## 2017-06-17 MED ORDER — SENNOSIDES-DOCUSATE SODIUM 8.6-50 MG PO TAB
1 | ORAL_TABLET | Freq: Two times a day (BID) | ORAL | 0 refills | Status: AC
Start: 2017-06-17 — End: ?

## 2017-06-17 MED ORDER — OXYCODONE 5 MG PO TAB
5-10 mg | ORAL_TABLET | ORAL | 0 refills | 6.00000 days | Status: AC | PRN
Start: 2017-06-17 — End: 2017-06-29

## 2017-06-19 ENCOUNTER — Encounter: Admit: 2017-06-19 | Discharge: 2017-06-19 | Payer: MEDICARE

## 2017-06-19 DIAGNOSIS — N393 Stress incontinence (female) (male): ICD-10-CM

## 2017-06-19 DIAGNOSIS — M1990 Unspecified osteoarthritis, unspecified site: ICD-10-CM

## 2017-06-19 DIAGNOSIS — I10 Essential (primary) hypertension: ICD-10-CM

## 2017-06-19 DIAGNOSIS — E785 Hyperlipidemia, unspecified: ICD-10-CM

## 2017-06-19 DIAGNOSIS — K219 Gastro-esophageal reflux disease without esophagitis: ICD-10-CM

## 2017-06-19 DIAGNOSIS — K222 Esophageal obstruction: ICD-10-CM

## 2017-06-19 DIAGNOSIS — C61 Malignant neoplasm of prostate: Principal | ICD-10-CM

## 2017-06-19 DIAGNOSIS — N529 Male erectile dysfunction, unspecified: ICD-10-CM

## 2017-06-19 DIAGNOSIS — M4316 Spondylolisthesis, lumbar region: ICD-10-CM

## 2017-06-19 DIAGNOSIS — E119 Type 2 diabetes mellitus without complications: ICD-10-CM

## 2017-06-29 ENCOUNTER — Ambulatory Visit: Admit: 2017-06-29 | Discharge: 2017-06-30 | Payer: MEDICARE

## 2017-06-29 ENCOUNTER — Encounter: Admit: 2017-06-29 | Discharge: 2017-06-29 | Payer: MEDICARE

## 2017-06-29 DIAGNOSIS — M4316 Spondylolisthesis, lumbar region: ICD-10-CM

## 2017-06-29 DIAGNOSIS — K219 Gastro-esophageal reflux disease without esophagitis: ICD-10-CM

## 2017-06-29 DIAGNOSIS — K222 Esophageal obstruction: ICD-10-CM

## 2017-06-29 DIAGNOSIS — E119 Type 2 diabetes mellitus without complications: ICD-10-CM

## 2017-06-29 DIAGNOSIS — Z5189 Encounter for other specified aftercare: Principal | ICD-10-CM

## 2017-06-29 DIAGNOSIS — I10 Essential (primary) hypertension: ICD-10-CM

## 2017-06-29 DIAGNOSIS — M1990 Unspecified osteoarthritis, unspecified site: ICD-10-CM

## 2017-06-29 DIAGNOSIS — M48061 Spinal stenosis, lumbar region without neurogenic claudication: ICD-10-CM

## 2017-06-29 DIAGNOSIS — E785 Hyperlipidemia, unspecified: ICD-10-CM

## 2017-06-29 DIAGNOSIS — N393 Stress incontinence (female) (male): ICD-10-CM

## 2017-06-29 DIAGNOSIS — C61 Malignant neoplasm of prostate: Principal | ICD-10-CM

## 2017-06-29 DIAGNOSIS — N529 Male erectile dysfunction, unspecified: ICD-10-CM

## 2017-06-29 MED ORDER — METHOCARBAMOL 500 MG PO TAB
500 mg | ORAL_TABLET | ORAL | 0 refills | Status: AC | PRN
Start: 2017-06-29 — End: ?

## 2017-06-29 MED ORDER — OXYCODONE 5 MG PO TAB
5-10 mg | ORAL_TABLET | ORAL | 0 refills | 6.00000 days | Status: AC | PRN
Start: 2017-06-29 — End: 2017-10-31

## 2017-07-25 ENCOUNTER — Encounter: Admit: 2017-07-25 | Discharge: 2017-07-25 | Payer: MEDICARE

## 2017-07-25 ENCOUNTER — Ambulatory Visit: Admit: 2017-07-25 | Discharge: 2017-07-25 | Payer: MEDICARE

## 2017-07-25 DIAGNOSIS — M48061 Spinal stenosis, lumbar region without neurogenic claudication: ICD-10-CM

## 2017-07-25 DIAGNOSIS — E785 Hyperlipidemia, unspecified: ICD-10-CM

## 2017-07-25 DIAGNOSIS — C61 Malignant neoplasm of prostate: Principal | ICD-10-CM

## 2017-07-25 DIAGNOSIS — K222 Esophageal obstruction: ICD-10-CM

## 2017-07-25 DIAGNOSIS — N529 Male erectile dysfunction, unspecified: ICD-10-CM

## 2017-07-25 DIAGNOSIS — M4316 Spondylolisthesis, lumbar region: ICD-10-CM

## 2017-07-25 DIAGNOSIS — N393 Stress incontinence (female) (male): ICD-10-CM

## 2017-07-25 DIAGNOSIS — E119 Type 2 diabetes mellitus without complications: ICD-10-CM

## 2017-07-25 DIAGNOSIS — K219 Gastro-esophageal reflux disease without esophagitis: ICD-10-CM

## 2017-07-25 DIAGNOSIS — M1990 Unspecified osteoarthritis, unspecified site: ICD-10-CM

## 2017-07-25 DIAGNOSIS — I10 Essential (primary) hypertension: ICD-10-CM

## 2017-07-26 ENCOUNTER — Encounter: Admit: 2017-07-26 | Discharge: 2017-07-26 | Payer: MEDICARE

## 2017-07-26 DIAGNOSIS — E119 Type 2 diabetes mellitus without complications: ICD-10-CM

## 2017-07-26 DIAGNOSIS — E785 Hyperlipidemia, unspecified: ICD-10-CM

## 2017-07-26 DIAGNOSIS — M1990 Unspecified osteoarthritis, unspecified site: ICD-10-CM

## 2017-07-26 DIAGNOSIS — N393 Stress incontinence (female) (male): ICD-10-CM

## 2017-07-26 DIAGNOSIS — M4316 Spondylolisthesis, lumbar region: ICD-10-CM

## 2017-07-26 DIAGNOSIS — C61 Malignant neoplasm of prostate: Principal | ICD-10-CM

## 2017-07-26 DIAGNOSIS — N529 Male erectile dysfunction, unspecified: ICD-10-CM

## 2017-07-26 DIAGNOSIS — I10 Essential (primary) hypertension: ICD-10-CM

## 2017-07-26 DIAGNOSIS — K222 Esophageal obstruction: ICD-10-CM

## 2017-07-26 DIAGNOSIS — K219 Gastro-esophageal reflux disease without esophagitis: ICD-10-CM

## 2017-09-12 ENCOUNTER — Ambulatory Visit: Admit: 2017-09-12 | Discharge: 2017-09-12 | Payer: MEDICARE

## 2017-09-12 ENCOUNTER — Ambulatory Visit: Admit: 2017-09-12 | Discharge: 2017-09-13 | Payer: MEDICARE

## 2017-09-12 ENCOUNTER — Encounter: Admit: 2017-09-12 | Discharge: 2017-09-12 | Payer: MEDICARE

## 2017-09-12 DIAGNOSIS — M1990 Unspecified osteoarthritis, unspecified site: ICD-10-CM

## 2017-09-12 DIAGNOSIS — N393 Stress incontinence (female) (male): ICD-10-CM

## 2017-09-12 DIAGNOSIS — N529 Male erectile dysfunction, unspecified: ICD-10-CM

## 2017-09-12 DIAGNOSIS — M4316 Spondylolisthesis, lumbar region: ICD-10-CM

## 2017-09-12 DIAGNOSIS — E119 Type 2 diabetes mellitus without complications: ICD-10-CM

## 2017-09-12 DIAGNOSIS — I10 Essential (primary) hypertension: ICD-10-CM

## 2017-09-12 DIAGNOSIS — K222 Esophageal obstruction: ICD-10-CM

## 2017-09-12 DIAGNOSIS — K219 Gastro-esophageal reflux disease without esophagitis: ICD-10-CM

## 2017-09-12 DIAGNOSIS — E785 Hyperlipidemia, unspecified: ICD-10-CM

## 2017-09-12 DIAGNOSIS — C61 Malignant neoplasm of prostate: Principal | ICD-10-CM

## 2017-10-17 LAB — PROSTATIC SPECIFIC ANTIGEN-PSA

## 2017-10-30 ENCOUNTER — Encounter: Admit: 2017-10-30 | Discharge: 2017-10-30 | Payer: MEDICARE

## 2017-10-30 DIAGNOSIS — Z8546 Personal history of malignant neoplasm of prostate: Principal | ICD-10-CM

## 2017-10-31 ENCOUNTER — Ambulatory Visit: Admit: 2017-10-31 | Discharge: 2017-11-01 | Payer: MEDICARE

## 2017-10-31 ENCOUNTER — Encounter: Admit: 2017-10-31 | Discharge: 2017-10-31 | Payer: MEDICARE

## 2017-10-31 DIAGNOSIS — C61 Malignant neoplasm of prostate: Principal | ICD-10-CM

## 2017-10-31 DIAGNOSIS — E119 Type 2 diabetes mellitus without complications: ICD-10-CM

## 2017-10-31 DIAGNOSIS — M4316 Spondylolisthesis, lumbar region: ICD-10-CM

## 2017-10-31 DIAGNOSIS — N529 Male erectile dysfunction, unspecified: ICD-10-CM

## 2017-10-31 DIAGNOSIS — K219 Gastro-esophageal reflux disease without esophagitis: ICD-10-CM

## 2017-10-31 DIAGNOSIS — Z8546 Personal history of malignant neoplasm of prostate: Principal | ICD-10-CM

## 2017-10-31 DIAGNOSIS — E785 Hyperlipidemia, unspecified: ICD-10-CM

## 2017-10-31 DIAGNOSIS — I10 Essential (primary) hypertension: ICD-10-CM

## 2017-10-31 DIAGNOSIS — K222 Esophageal obstruction: ICD-10-CM

## 2017-10-31 DIAGNOSIS — N393 Stress incontinence (female) (male): ICD-10-CM

## 2017-10-31 DIAGNOSIS — M1990 Unspecified osteoarthritis, unspecified site: ICD-10-CM

## 2017-11-01 DIAGNOSIS — N393 Stress incontinence (female) (male): ICD-10-CM

## 2017-11-01 DIAGNOSIS — N5231 Erectile dysfunction following radical prostatectomy: ICD-10-CM

## 2017-11-04 ENCOUNTER — Encounter: Admit: 2017-11-04 | Discharge: 2017-11-04 | Payer: MEDICARE

## 2017-11-04 DIAGNOSIS — N529 Male erectile dysfunction, unspecified: ICD-10-CM

## 2017-11-04 DIAGNOSIS — M4316 Spondylolisthesis, lumbar region: ICD-10-CM

## 2017-11-04 DIAGNOSIS — K219 Gastro-esophageal reflux disease without esophagitis: ICD-10-CM

## 2017-11-04 DIAGNOSIS — I10 Essential (primary) hypertension: ICD-10-CM

## 2017-11-04 DIAGNOSIS — E785 Hyperlipidemia, unspecified: ICD-10-CM

## 2017-11-04 DIAGNOSIS — C61 Malignant neoplasm of prostate: Principal | ICD-10-CM

## 2017-11-04 DIAGNOSIS — E119 Type 2 diabetes mellitus without complications: ICD-10-CM

## 2017-11-04 DIAGNOSIS — M1990 Unspecified osteoarthritis, unspecified site: ICD-10-CM

## 2017-11-04 DIAGNOSIS — N393 Stress incontinence (female) (male): ICD-10-CM

## 2017-11-04 DIAGNOSIS — K222 Esophageal obstruction: ICD-10-CM

## 2017-11-14 ENCOUNTER — Encounter: Admit: 2017-11-14 | Discharge: 2017-11-14 | Payer: MEDICARE

## 2017-11-14 ENCOUNTER — Ambulatory Visit: Admit: 2017-11-14 | Discharge: 2017-11-15 | Payer: MEDICARE

## 2017-11-14 DIAGNOSIS — N393 Stress incontinence (female) (male): ICD-10-CM

## 2017-11-14 DIAGNOSIS — N529 Male erectile dysfunction, unspecified: ICD-10-CM

## 2017-11-14 DIAGNOSIS — C61 Malignant neoplasm of prostate: Principal | ICD-10-CM

## 2017-11-14 DIAGNOSIS — I10 Essential (primary) hypertension: ICD-10-CM

## 2017-11-14 DIAGNOSIS — K219 Gastro-esophageal reflux disease without esophagitis: ICD-10-CM

## 2017-11-14 DIAGNOSIS — E119 Type 2 diabetes mellitus without complications: ICD-10-CM

## 2017-11-14 DIAGNOSIS — E785 Hyperlipidemia, unspecified: ICD-10-CM

## 2017-11-14 DIAGNOSIS — M4316 Spondylolisthesis, lumbar region: ICD-10-CM

## 2017-11-14 DIAGNOSIS — M1990 Unspecified osteoarthritis, unspecified site: ICD-10-CM

## 2017-11-14 DIAGNOSIS — K222 Esophageal obstruction: ICD-10-CM

## 2017-11-14 MED ORDER — CIPROFLOXACIN HCL 500 MG PO TAB
500 mg | ORAL_TABLET | Freq: Two times a day (BID) | ORAL | 0 refills | 10.00000 days | Status: AC
Start: 2017-11-14 — End: ?

## 2017-11-15 DIAGNOSIS — N393 Stress incontinence (female) (male): Principal | ICD-10-CM

## 2017-11-15 DIAGNOSIS — Z960 Presence of urogenital implants: ICD-10-CM

## 2017-11-17 ENCOUNTER — Encounter: Admit: 2017-11-17 | Discharge: 2017-11-17 | Payer: MEDICARE

## 2017-11-17 DIAGNOSIS — I10 Essential (primary) hypertension: ICD-10-CM

## 2017-11-17 DIAGNOSIS — M4316 Spondylolisthesis, lumbar region: ICD-10-CM

## 2017-11-17 DIAGNOSIS — N393 Stress incontinence (female) (male): ICD-10-CM

## 2017-11-17 DIAGNOSIS — C61 Malignant neoplasm of prostate: Principal | ICD-10-CM

## 2017-11-17 DIAGNOSIS — K222 Esophageal obstruction: ICD-10-CM

## 2017-11-17 DIAGNOSIS — K219 Gastro-esophageal reflux disease without esophagitis: ICD-10-CM

## 2017-11-17 DIAGNOSIS — M1990 Unspecified osteoarthritis, unspecified site: ICD-10-CM

## 2017-11-17 DIAGNOSIS — E119 Type 2 diabetes mellitus without complications: ICD-10-CM

## 2017-11-17 DIAGNOSIS — E785 Hyperlipidemia, unspecified: ICD-10-CM

## 2017-11-17 DIAGNOSIS — N529 Male erectile dysfunction, unspecified: ICD-10-CM

## 2017-11-20 ENCOUNTER — Encounter: Admit: 2017-11-20 | Discharge: 2017-11-20 | Payer: MEDICARE

## 2017-11-20 DIAGNOSIS — N393 Stress incontinence (female) (male): Principal | ICD-10-CM

## 2017-12-12 ENCOUNTER — Ambulatory Visit: Admit: 2017-12-12 | Discharge: 2017-12-12 | Payer: MEDICARE

## 2017-12-12 ENCOUNTER — Encounter: Admit: 2017-12-12 | Discharge: 2017-12-12 | Payer: MEDICARE

## 2017-12-12 DIAGNOSIS — M4316 Spondylolisthesis, lumbar region: ICD-10-CM

## 2017-12-12 DIAGNOSIS — K222 Esophageal obstruction: ICD-10-CM

## 2017-12-12 DIAGNOSIS — K219 Gastro-esophageal reflux disease without esophagitis: ICD-10-CM

## 2017-12-12 DIAGNOSIS — I10 Essential (primary) hypertension: ICD-10-CM

## 2017-12-12 DIAGNOSIS — N529 Male erectile dysfunction, unspecified: ICD-10-CM

## 2017-12-12 DIAGNOSIS — E785 Hyperlipidemia, unspecified: ICD-10-CM

## 2017-12-12 DIAGNOSIS — M1990 Unspecified osteoarthritis, unspecified site: ICD-10-CM

## 2017-12-12 DIAGNOSIS — N393 Stress incontinence (female) (male): ICD-10-CM

## 2017-12-12 DIAGNOSIS — E119 Type 2 diabetes mellitus without complications: ICD-10-CM

## 2017-12-12 DIAGNOSIS — C61 Malignant neoplasm of prostate: Principal | ICD-10-CM

## 2018-10-24 LAB — PROSTATIC SPECIFIC ANTIGEN-PSA

## 2018-10-28 NOTE — Progress Notes
Date of Service: 11/01/2018     Subjective:             Devin Kelly is a 78 y.o. male.    Chief Complaint   Patient presents with   ??? Prostate Cancer       History of Present Illness  Very pleasant Caucasian gentleman w/ prostate cancer s/p non-nerve sparing robotic asst lap prostatectomy (RALP) on 09/25/2011, by Dr. Laveda Norman.  Post-prostatectomy PSA undetectable, thus far.      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 Larita Fife.  SUI significantly improved down to 1 light security pad/ day.  Recently SUI worse --> 3-4 pads/ day.  Denies obstructive LUTS, dysuria, gross hematuria, or UTI.    Cysto (11/14/2017) --> AUS appears reasonably well coapted.  No evidence of AUS erosion.    (+)baseline ED prior to non-nerve sparing RALP.  Not a quality of life issue, not interested in ED tx options.      No new urologic concerns or complaints.    HPI reviewed on 11/01/2018 & unchanged since last visit.         Review of Systems   Constitutional: Negative for activity change, appetite change, chills, diaphoresis, fatigue, fever and unexpected weight change.   HENT: Negative for congestion, hearing loss, mouth sores and sinus pressure.    Eyes: Negative for visual disturbance.   Respiratory: Negative for apnea, cough, chest tightness and shortness of breath.    Cardiovascular: Negative for chest pain, palpitations and leg swelling.   Gastrointestinal: Negative for abdominal pain, blood in stool, constipation, diarrhea, nausea, rectal pain and vomiting.   Genitourinary: Negative for decreased urine volume, difficulty urinating, discharge, dysuria, enuresis, flank pain, frequency, genital sores, hematuria, penile pain, penile swelling, scrotal swelling, testicular pain and urgency.   Musculoskeletal: Negative for arthralgias, back pain, gait problem and myalgias.   Skin: Negative for rash and wound. Neurological: Negative for dizziness, tremors, seizures, syncope, weakness, light-headedness, numbness and headaches.   Hematological: Negative for adenopathy. Does not bruise/bleed easily.   Psychiatric/Behavioral: Negative for decreased concentration and dysphoric mood. The patient is not nervous/anxious.        Objective:         ??? acetaminophen (TYLENOL) 325 mg tablet Take two tablets by mouth every 4 hours.   ??? cyanocobalamin(+) (VITAMIN B-12) 100 mcg tablet Take 100 mcg by mouth twice daily.   ??? enalapril/hydrochlorothiazide (VASERETIC) 10/25 mg tablet Take 1 Tab by mouth at bedtime daily.   ??? fenofibrate(+) (TRIGLIDE) 160 mg tablet Take 160 mg by mouth daily. Take with food.   ??? fish oil /omega-3 fatty acids (SEA-OMEGA) 340/1000 mg capsule Take 4 capsules by mouth at bedtime daily.   ??? glipiZIDE CR (GLUCOTROL XL) 5 mg tablet Take 5 mg by mouth daily.   ??? metFORMIN (GLUCOPHAGE) 1,000 mg tablet Take 1,000 mg by mouth twice daily with meals.     ??? methocarbamol (ROBAXIN) 500 mg tablet Take one tablet by mouth every 8 hours as needed for Spasms (Back Spasms).   ??? omeprazole DR(+) (PRILOSEC) 20 mg capsule Take 20 mg by mouth at bedtime daily.   ??? senna/docusate (SENOKOT-S) 8.6/50 mg tablet Take one tablet by mouth twice daily.   ??? vitamins, multiple tablet Take 1 tablet by mouth at bedtime daily.       Vitals:    11/01/18 1019   BP: 116/54   BP Source: Arm, Left Upper   Patient Position: Sitting  Pulse: 59   Weight: 97.5 kg (215 lb)   Height: 180.3 cm (71)   PainSc: Zero       Body mass index is 29.99 kg/m???.       Physical Exam   Constitutional: He is oriented to person, place, and time. He appears well-developed and well-nourished. No distress.   HENT:   Head: Normocephalic and atraumatic.   Eyes: No scleral icterus.   Pulmonary/Chest: Effort normal. No respiratory distress.   Abdominal: Soft. He exhibits no distension. Hernia confirmed negative in the right inguinal area and confirmed negative in the left inguinal area.   Well healed surgical scars.  Obese.  (+)RLQ AUS reservoir scar.   Genitourinary:    Testes and penis normal.   Right testis shows no mass, no swelling and no tenderness. Right testis is descended. Left testis shows no mass, no swelling and no tenderness. Left testis is descended. Uncircumcised. No phimosis, paraphimosis, hypospadias, penile erythema or penile tenderness. No discharge found.    Genitourinary Comments: Foreskin easily retracted.  No penile lesions.  (+)AUS pump easily palpated in (R) hemiscrotum.  AUS pump in good position.  AUS pump cycles okay.     Neurological: He is alert and oriented to person, place, and time.   Skin: Skin is warm and dry. No erythema. No pallor.   Psychiatric: He has a normal mood and affect. His behavior is normal.   Vitals reviewed.        Lab Results   Component Value Date    PSA <0.13 10/24/2018       Assessment and Plan:    Problem   History of Prostate Cancer    PSA (07/19/2011) = 5.49 ng/mL.  PNBx (08/10/2011): cT1c; (L) Gleason 3+3=6, 4/6 cores, 20-55%; (R) Gleason 3+3=6, 4/6 cores, 30-80%; Dr. Larwance Rote.  Non-Nerve Sparing Robotic Asst Lap Prostatectomy (RALP) -- 09/25/2011; Dr. Laveda Norman.  pT2c N0 Mx, Gleason 3+3=6, Margins Negative.     Fleeta Emmer (Stress Urinary Incontinence), Male    Post-prostatectomy SUI; 8-10 pads/ d.  Artificial urinary sphincter (AUS) -- 07/07/2013; Dr. Larita Fife.  Post-AUS ~ 1 light security pad/ d.  Cysto (11/14/2017): no evidence of urethral erosion, good urethral coaptation.         History of prostate cancer  PSA reviewed & remains undetectable.  RTC 1 yr w/ outside PSA.    SUI (stress urinary incontinence), male  Continue using artifical urinary sphincter (AUS) as directed.    Orders Placed This Encounter   ??? PROSTATIC SPECIFIC ANTIGEN-PSA       Marin Roberts, PA-C  Urology

## 2018-11-01 ENCOUNTER — Encounter: Admit: 2018-11-01 | Discharge: 2018-11-01

## 2018-11-01 DIAGNOSIS — E119 Type 2 diabetes mellitus without complications: Secondary | ICD-10-CM

## 2018-11-01 DIAGNOSIS — K222 Esophageal obstruction: Secondary | ICD-10-CM

## 2018-11-01 DIAGNOSIS — M4316 Spondylolisthesis, lumbar region: Secondary | ICD-10-CM

## 2018-11-01 DIAGNOSIS — K219 Gastro-esophageal reflux disease without esophagitis: Secondary | ICD-10-CM

## 2018-11-01 DIAGNOSIS — Z8546 Personal history of malignant neoplasm of prostate: Principal | ICD-10-CM

## 2018-11-01 DIAGNOSIS — N393 Stress incontinence (female) (male): Secondary | ICD-10-CM

## 2018-11-01 DIAGNOSIS — N529 Male erectile dysfunction, unspecified: Secondary | ICD-10-CM

## 2018-11-01 DIAGNOSIS — M1990 Unspecified osteoarthritis, unspecified site: Secondary | ICD-10-CM

## 2018-11-01 DIAGNOSIS — E785 Hyperlipidemia, unspecified: Secondary | ICD-10-CM

## 2018-11-01 DIAGNOSIS — I10 Essential (primary) hypertension: Secondary | ICD-10-CM

## 2018-11-01 DIAGNOSIS — C61 Malignant neoplasm of prostate: Secondary | ICD-10-CM

## 2018-11-02 ENCOUNTER — Ambulatory Visit: Admit: 2018-11-01 | Discharge: 2018-11-02

## 2018-11-03 ENCOUNTER — Encounter: Admit: 2018-11-03 | Discharge: 2018-11-03

## 2018-11-03 DIAGNOSIS — N529 Male erectile dysfunction, unspecified: Secondary | ICD-10-CM

## 2018-11-03 DIAGNOSIS — M4316 Spondylolisthesis, lumbar region: Secondary | ICD-10-CM

## 2018-11-03 DIAGNOSIS — I10 Essential (primary) hypertension: Secondary | ICD-10-CM

## 2018-11-03 DIAGNOSIS — K222 Esophageal obstruction: Secondary | ICD-10-CM

## 2018-11-03 DIAGNOSIS — E785 Hyperlipidemia, unspecified: Secondary | ICD-10-CM

## 2018-11-03 DIAGNOSIS — N393 Stress incontinence (female) (male): Secondary | ICD-10-CM

## 2018-11-03 DIAGNOSIS — M1990 Unspecified osteoarthritis, unspecified site: Secondary | ICD-10-CM

## 2018-11-03 DIAGNOSIS — C61 Malignant neoplasm of prostate: Secondary | ICD-10-CM

## 2018-11-03 DIAGNOSIS — K219 Gastro-esophageal reflux disease without esophagitis: Secondary | ICD-10-CM

## 2018-11-03 DIAGNOSIS — E119 Type 2 diabetes mellitus without complications: Secondary | ICD-10-CM

## 2018-11-03 NOTE — Assessment & Plan Note
Continue using artifical urinary sphincter (AUS) as directed.

## 2018-11-03 NOTE — Assessment & Plan Note
PSA reviewed & remains undetectable.  RTC 1 yr w/ outside PSA.

## 2018-11-06 IMAGING — CR CHEST
2 series · 2 of 2 positions shown · non-contrast
Comparison: none

[shoulder grashey]
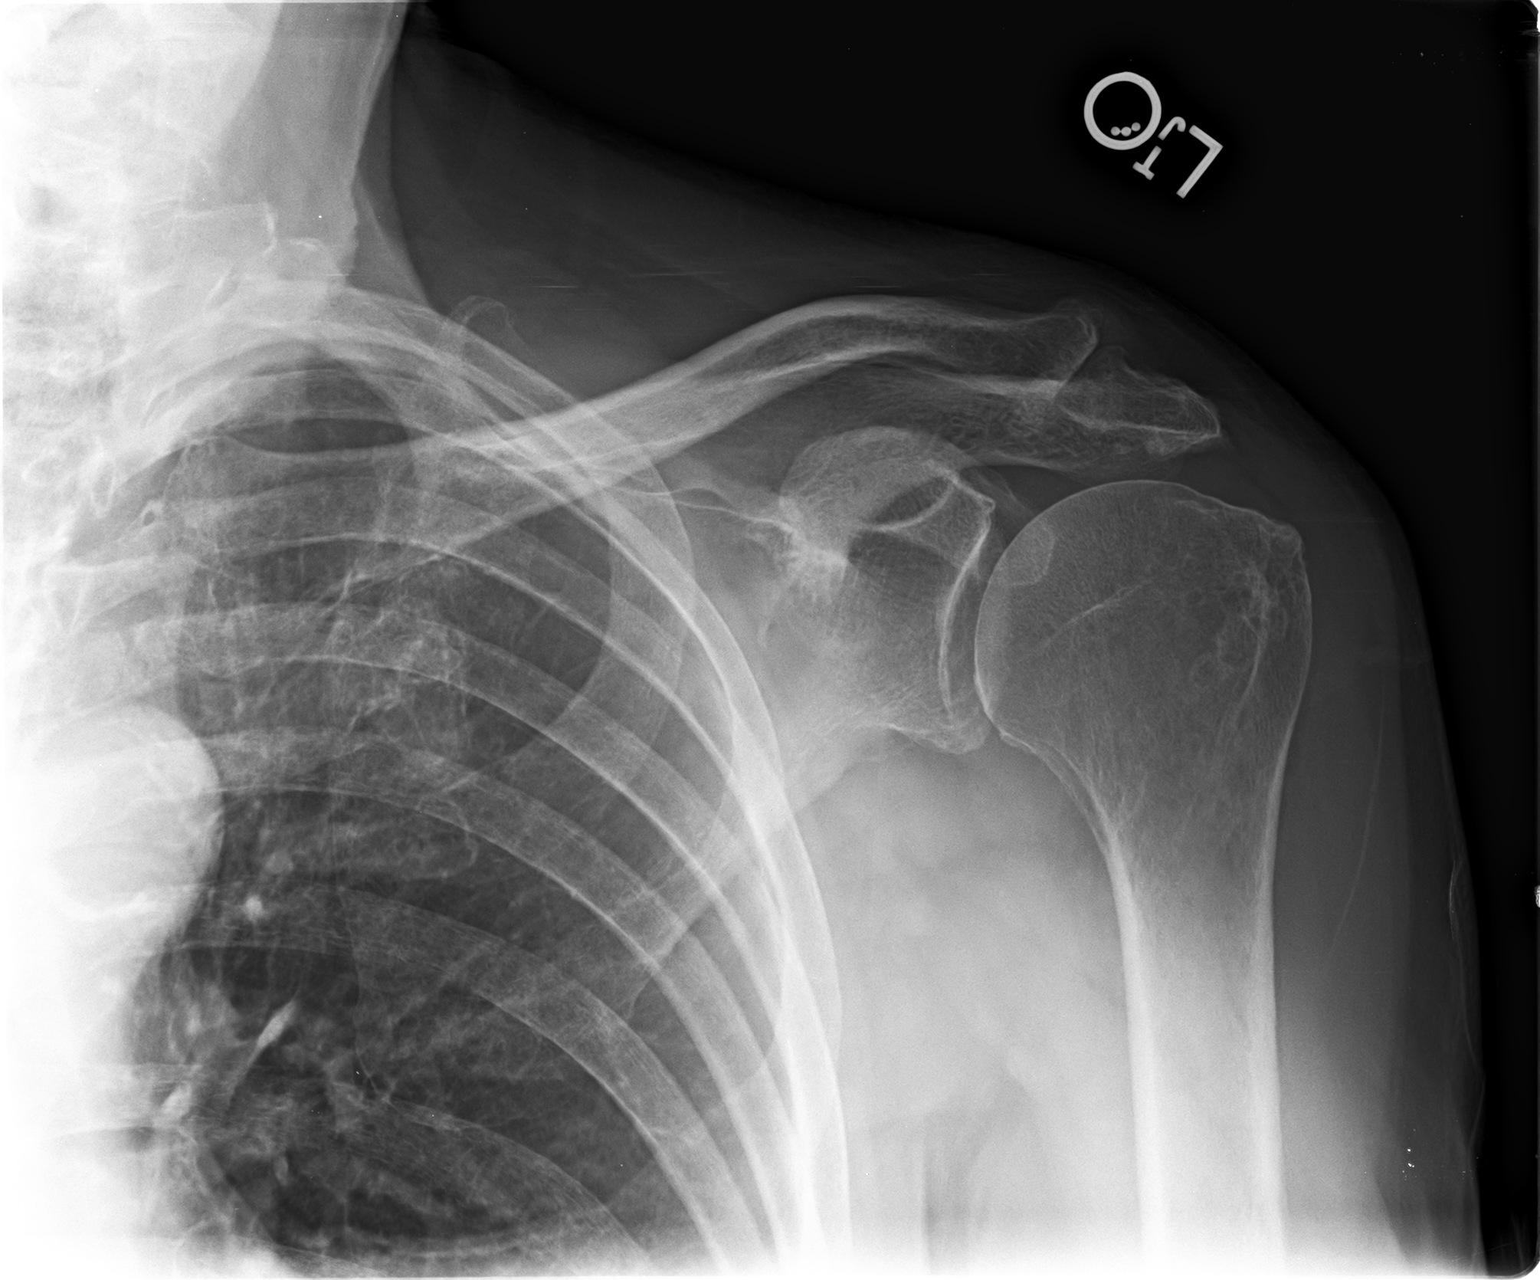

[shoulder y-view]
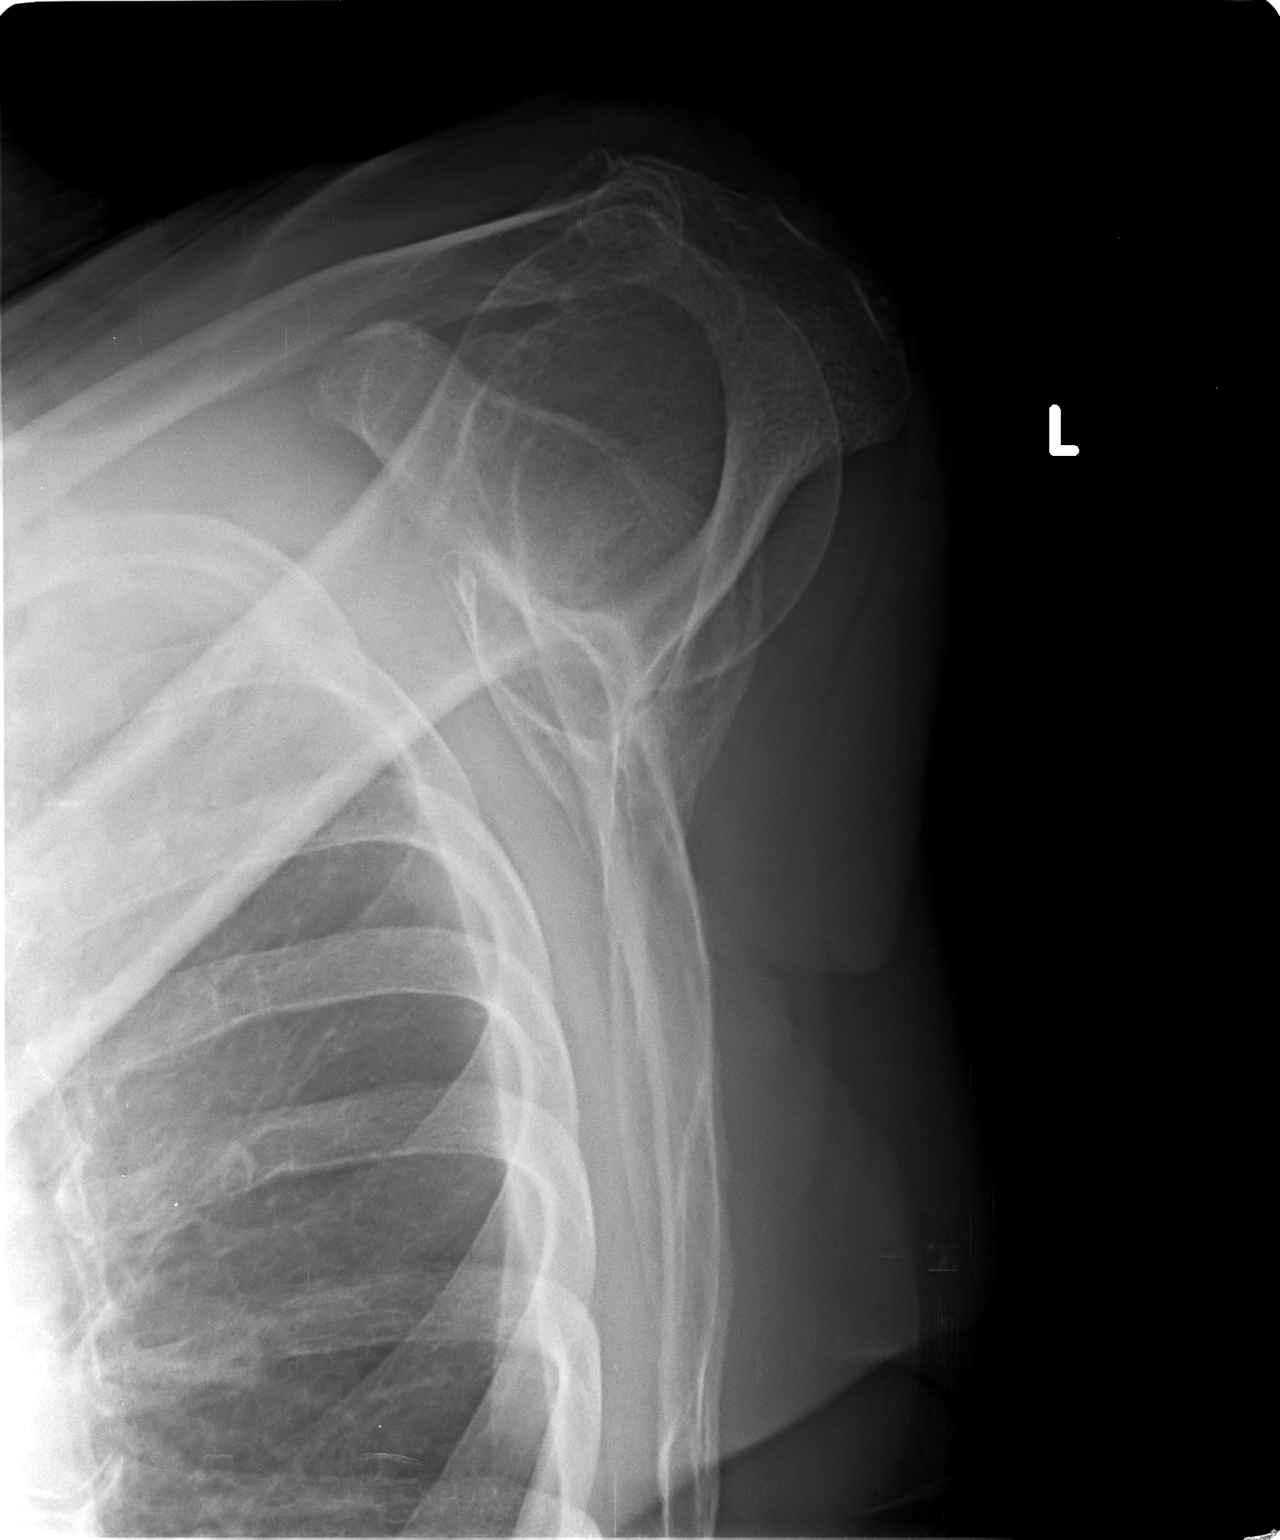

[2 of 2 positions shown; findings below may reference images not displayed]

DIAGNOSTIC STUDIES

EXAM
Left shoulder radiographs.

INDICATION
shoulder achiness, crepitus x approx 3 weeks.
pt c/o pain in lt shoulder and crepitus x 3 wks. no known injury. pt states he has good rom but
pain with it. sz/tj

TECHNIQUE
Three views of the left shoulder.

COMPARISONS
None.

FINDINGS
Moderate acromioclavicular and mild to moderate glenohumeral osteoarthropathy. Bone
demineralization. No displaced fracture. No aggressive osseous lesion.

IMPRESSION
Osteoarthropathy. No acute osseous abnormality.

Tech Notes:

pt c/o pain in lt shoulder and crepitus x 3 wks. no known injury. pt states he has good rom but pain
with it. sz/tj

## 2018-11-14 IMAGING — CR NECK
1 series · 1 of 1 positions shown · non-contrast
Comparison: none

[c-spine ap]
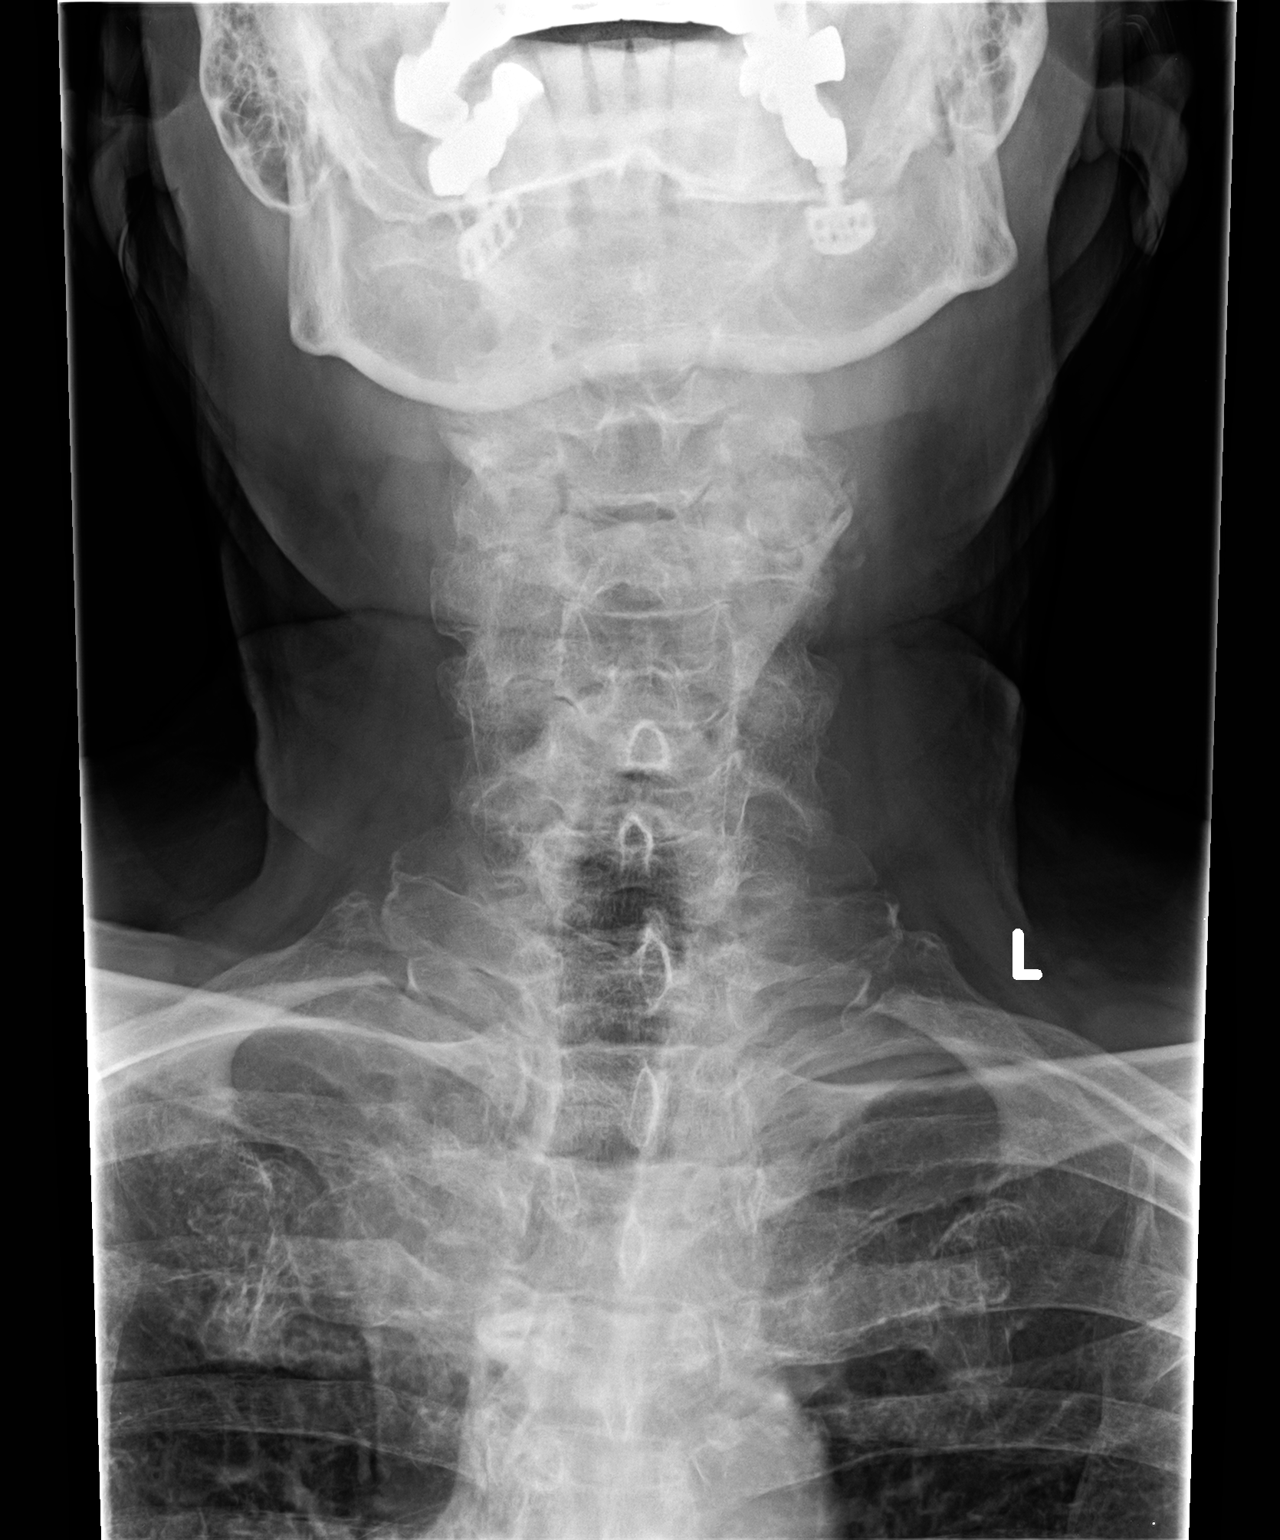

[1 of 1 positions shown; findings below may reference images not displayed]

DIAGNOSTIC STUDIES

EXAM
Cervical spine

INDICATION
NECK PAIN
LEFT NECK, LEFT SHOULDER PAIN, RADIATING DOWN LEFT ARM.

TECHNIQUE
AP lateral both oblique and odontoid views were obtained.

COMPARISONS
None

FINDINGS
No fractures dislocations are seen. There is narrowing and anterior osteophytic spurring at C4-5 5
6 and 6 7. Mild bilateral bony neural foraminal stenosis is noted at C6-7.

IMPRESSION
Spondylitic changes of the lower cervical spine with bilateral bony neural foraminal stenosis at

Tech Notes:

LEFT NECK, LEFT SHOULDER PAIN, RADIATING DOWN LEFT ARM.

## 2019-10-31 ENCOUNTER — Encounter: Admit: 2019-10-31 | Discharge: 2019-10-31 | Payer: MEDICARE

## 2019-10-31 ENCOUNTER — Ambulatory Visit: Admit: 2019-10-31 | Discharge: 2019-11-01 | Payer: MEDICARE

## 2019-10-31 DIAGNOSIS — K222 Esophageal obstruction: Secondary | ICD-10-CM

## 2019-10-31 DIAGNOSIS — K219 Gastro-esophageal reflux disease without esophagitis: Secondary | ICD-10-CM

## 2019-10-31 DIAGNOSIS — M1990 Unspecified osteoarthritis, unspecified site: Secondary | ICD-10-CM

## 2019-10-31 DIAGNOSIS — E785 Hyperlipidemia, unspecified: Secondary | ICD-10-CM

## 2019-10-31 DIAGNOSIS — N5231 Erectile dysfunction following radical prostatectomy: Secondary | ICD-10-CM

## 2019-10-31 DIAGNOSIS — C61 Malignant neoplasm of prostate: Secondary | ICD-10-CM

## 2019-10-31 DIAGNOSIS — N529 Male erectile dysfunction, unspecified: Secondary | ICD-10-CM

## 2019-10-31 DIAGNOSIS — N393 Stress incontinence (female) (male): Secondary | ICD-10-CM

## 2019-10-31 DIAGNOSIS — Z8546 Personal history of malignant neoplasm of prostate: Secondary | ICD-10-CM

## 2019-10-31 DIAGNOSIS — I10 Essential (primary) hypertension: Secondary | ICD-10-CM

## 2019-10-31 DIAGNOSIS — M4316 Spondylolisthesis, lumbar region: Secondary | ICD-10-CM

## 2019-10-31 DIAGNOSIS — E119 Type 2 diabetes mellitus without complications: Secondary | ICD-10-CM

## 2019-10-31 NOTE — Assessment & Plan Note
Continue using artifical urinary sphincter (AUS) as directed.

## 2019-10-31 NOTE — Progress Notes
Date of Service: 10/31/2019       Subjective:             Devin Kelly is a 79 y.o. male.      Chief Complaint   Patient presents with   ? Prostate Cancer         History of Present Illness  Very pleasant Caucasian gentleman w/ prostate cancer s/p non-nerve sparing robotic asst lap prostatectomy (RALP) on 09/25/2011, by Dr. Laveda Norman.  Post-prostatectomy PSA undetectable, thus far.      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 Larita Fife.  SUI significantly improved down to 1 light security pad/ day.  AUS functioning well.  Denies obstructive LUTS, dysuria, gross hematuria, or UTI.    Cysto (11/14/2017) --> AUS appears reasonably well coapted.  No evidence of AUS erosion.    (+)baseline ED prior to non-nerve sparing RALP.  Not a quality of life issue, not interested in ED tx options.      No new urologic concerns or complaints.    HPI reviewed on 10/31/2019 & unchanged since last visit.           Review of Systems      Objective:         ? acetaminophen (TYLENOL) 325 mg tablet Take two tablets by mouth every 4 hours.   ? cyanocobalamin(+) (VITAMIN B-12) 100 mcg tablet Take 100 mcg by mouth twice daily.   ? enalapril/hydrochlorothiazide (VASERETIC) 10/25 mg tablet Take 1 Tab by mouth at bedtime daily.   ? fenofibrate(+) (TRIGLIDE) 160 mg tablet Take 160 mg by mouth daily. Take with food.   ? fish oil /omega-3 fatty acids (SEA-OMEGA) 340/1000 mg capsule Take 4 capsules by mouth at bedtime daily.   ? glipiZIDE CR (GLUCOTROL XL) 5 mg tablet Take 5 mg by mouth daily.   ? metFORMIN (GLUCOPHAGE) 1,000 mg tablet Take 1,000 mg by mouth twice daily with meals.     ? methocarbamol (ROBAXIN) 500 mg tablet Take one tablet by mouth every 8 hours as needed for Spasms (Back Spasms).   ? omeprazole DR(+) (PRILOSEC) 20 mg capsule Take 20 mg by mouth at bedtime daily.   ? senna/docusate (SENOKOT-S) 8.6/50 mg tablet Take one tablet by mouth twice daily.   ? vitamins, multiple tablet Take 1 tablet by mouth at bedtime daily.         Vitals:    10/31/19 0948   BP: 125/60   Pulse: 68   Resp: 18   Weight: 98.9 kg (218 lb)   Height: 180.3 cm (71)   PainSc: Zero       Body mass index is 30.4 kg/m?Marland Kitchen       Physical Exam   Constitutional: He is oriented to person, place, and time. He appears well-developed and well-nourished. No distress.   HENT:   Head: Normocephalic and atraumatic.   Pulmonary/Chest: Effort normal. No respiratory distress.   Neurological: He is alert and oriented to person, place, and time.   Psychiatric: He has a normal mood and affect. His behavior is normal. Judgment and thought content normal.   Vitals reviewed.             Lab Results   Component Value Date    PSA <0.13 10/23/2019           Assessment and Plan:    Problem   History of Prostate Cancer    PSA (07/19/2011) = 5.49 ng/mL.  PNBx (08/10/2011): cT1c; (  L) Gleason 3+3=6, 4/6 cores, 20-55%; (R) Gleason 3+3=6, 4/6 cores, 30-80%; Dr. Larwance Rote.  Non-Nerve Sparing Robotic Asst Lap Prostatectomy (RALP) -- 09/25/2011; Dr. Laveda Norman.  pT2c N0 Mx, Gleason 3+3=6, Margins Negative.     Fleeta Emmer (Stress Urinary Incontinence), Male    Post-prostatectomy SUI; 8-10 pads/ d.  Artificial urinary sphincter (AUS) -- 07/07/2013; Dr. Larita Fife.  Post-AUS ~ 1 light security pad/ d.  Cysto (11/14/2017): no evidence of urethral erosion, good urethral coaptation.     Erectile Dysfunction Following Radical Prostatectomy    (+)baseline ED prior to prostatectomy.  Failed Viagra & Levitra.  S/p Non-nerve sparing prostatectomy.           History of prostate cancer  Outside PSA reviewed & remains undetectable.  RTC 1 yr w/ outside PSA.  -- will d/c back to PCP ~ June 2023.    Erectile dysfunction following radical prostatectomy  Discussed ED tx options:  -- PDE-5 inhibitors --> unlikely to be effective after non-nerve sparing prostatectomy.  -- MUSE  -- Vacuum Erection Device (VED)  -- Penile constriction band  -- Penile injections (ICI)  -- Penile prosthesis  After careful consideration, he is not interested in any of these options at this time.    SUI (stress urinary incontinence), male  Continue using artifical urinary sphincter (AUS) as directed.        Orders Placed This Encounter   ? PROSTATIC SPECIFIC ANTIGEN-PSA         Marin Roberts, PA-C  Urology

## 2020-09-03 IMAGING — CR SHOULDCMLT
2 series · 2 of 2 positions shown · non-contrast
Comparison: none

[shoulder external]
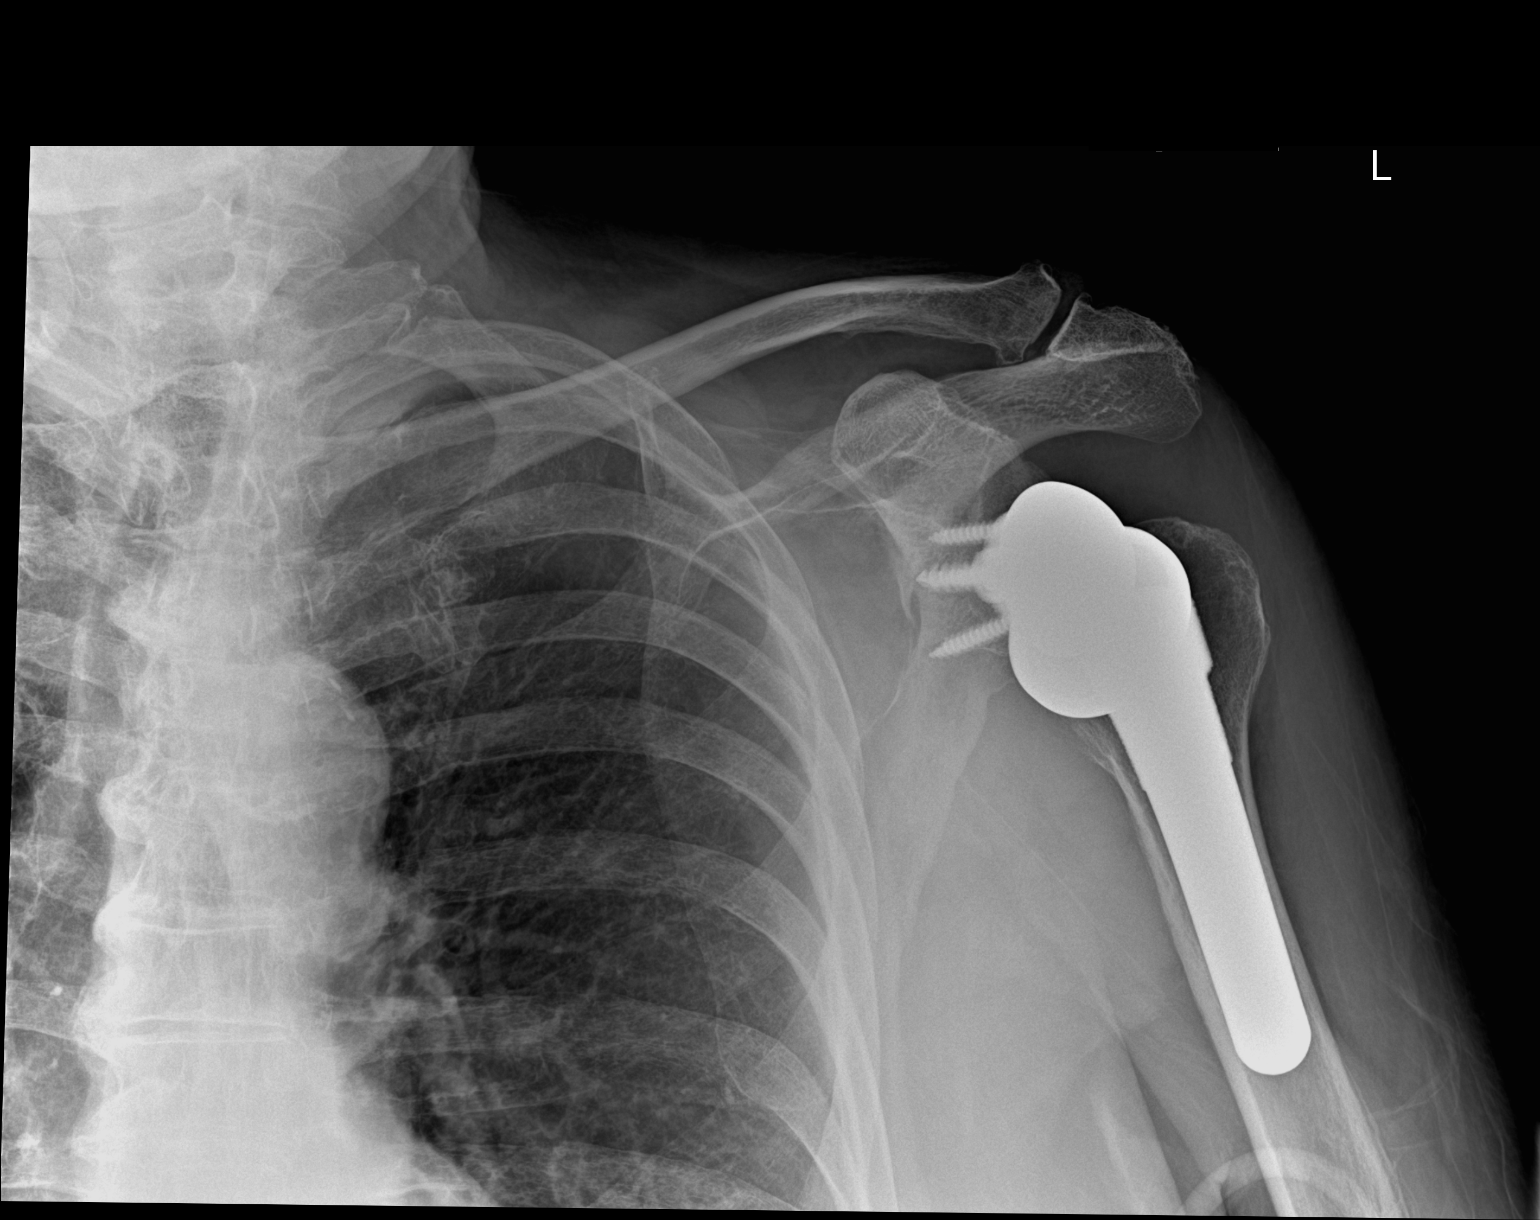

[shoulder y-view]
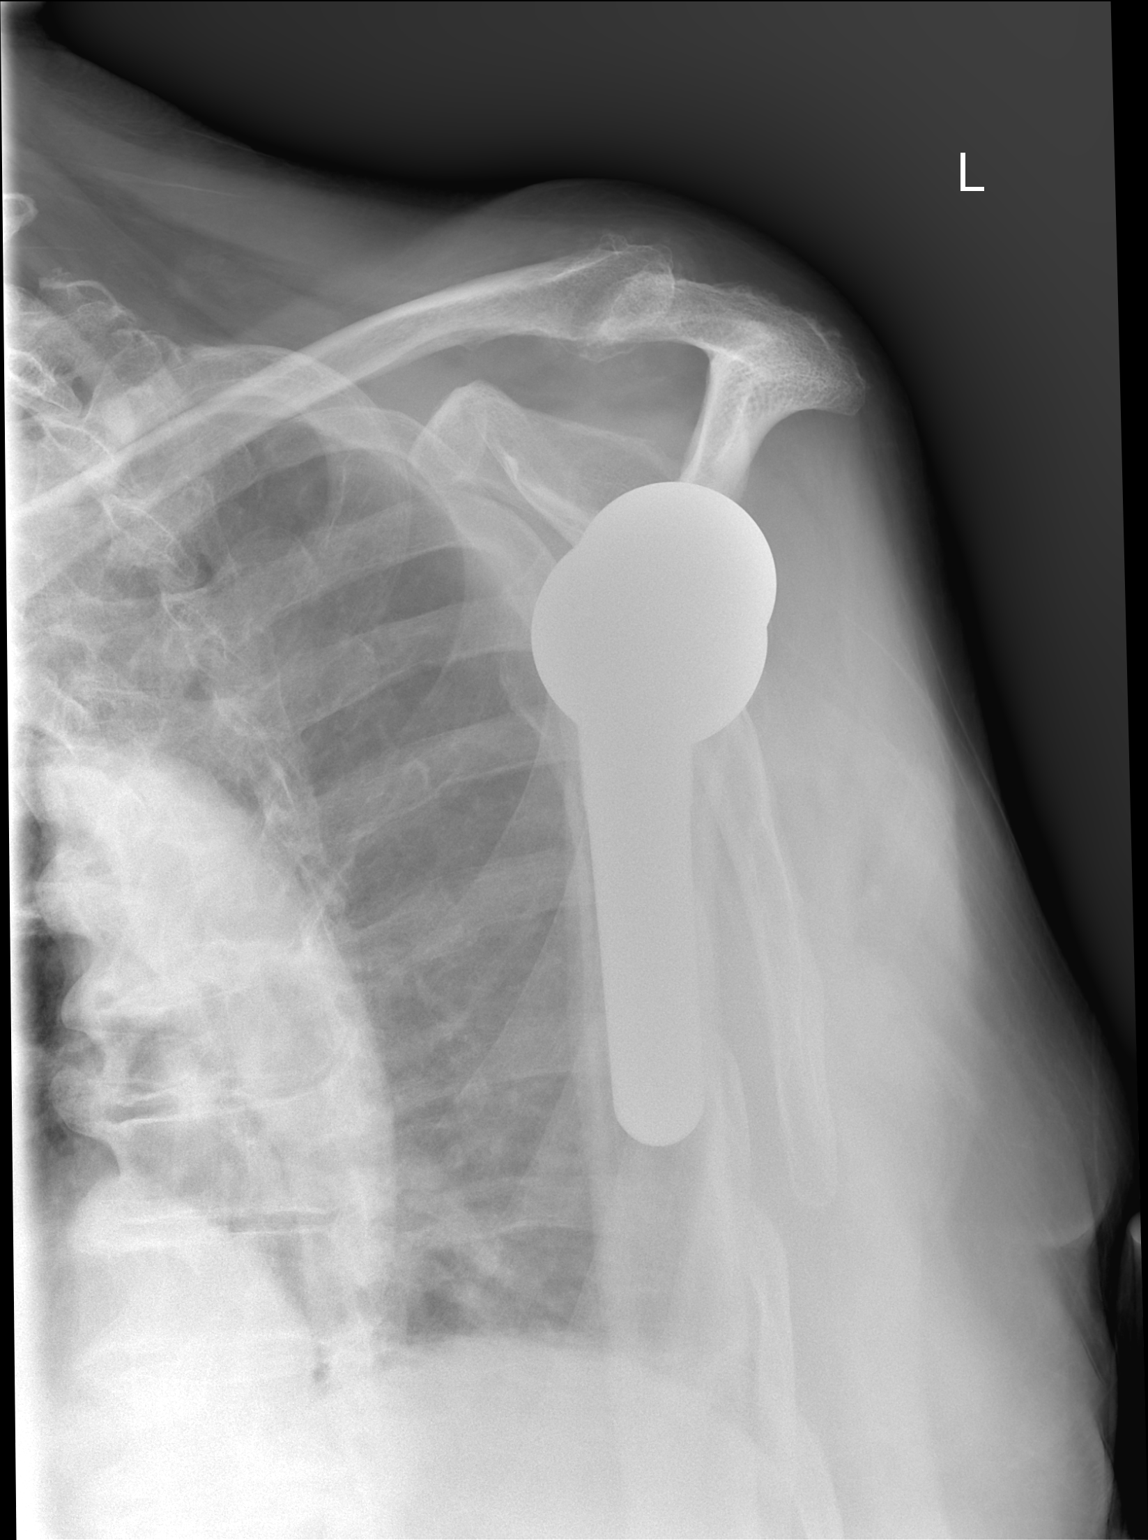

[2 of 2 positions shown; findings below may reference images not displayed]

DIAGNOSTIC STUDIES

EXAM

LEFT SHOULDER

INDICATION

s/p surgery 3 weeks
Pt c/o left shoulder pain. s/p left shoulder surgery x 3 weeks ago. CF/TB

TECHNIQUE

AP and Y views of the  left shoulder

COMPARISONS

08/24/2020

FINDINGS

No dislocation nor acute bony injury is identified. Left shoulder reverse arthroplasty is
identified and unchanged. No periprosthetic complication is identified. Degenerative are present
about the left AC joint with sclerosis and spurring.

Stable left shoulder reverse arthroplasty.

Tech Notes:

Pt c/o left shoulder pain. s/p left shoulder surgery x 3 weeks ago. CF/TB

## 2020-09-21 IMAGING — CR SHOULDCMLT
3 series · 3 of 3 positions shown · non-contrast
Comparison: none

[shoulder external]
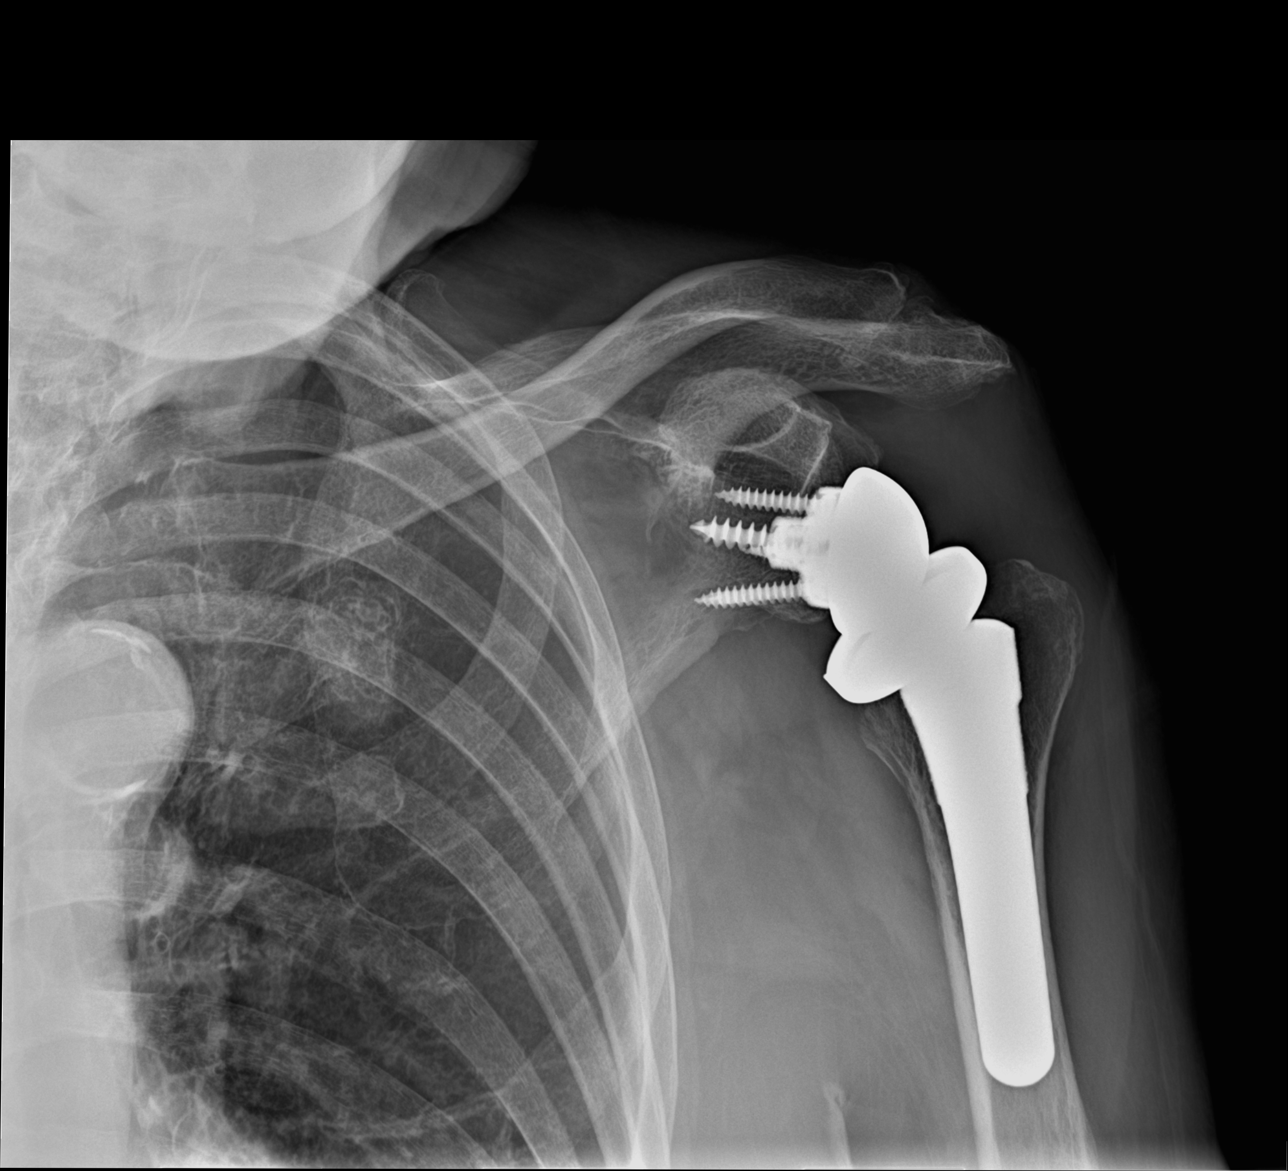

[shoulder y-view]
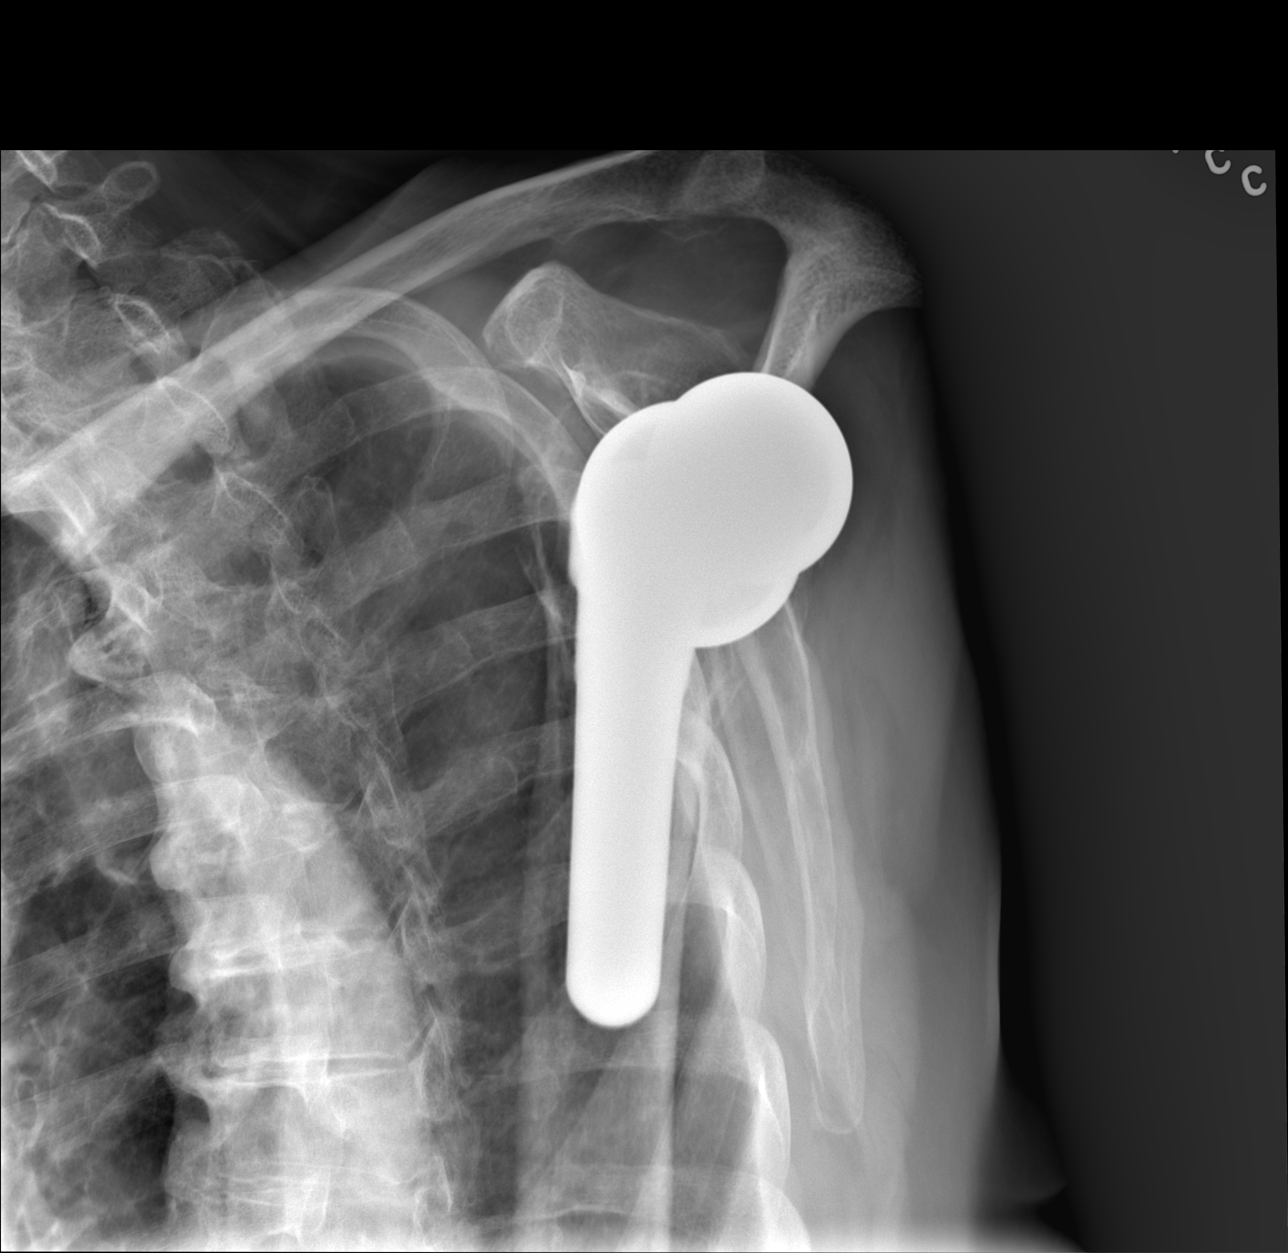

[shoulder axillary]
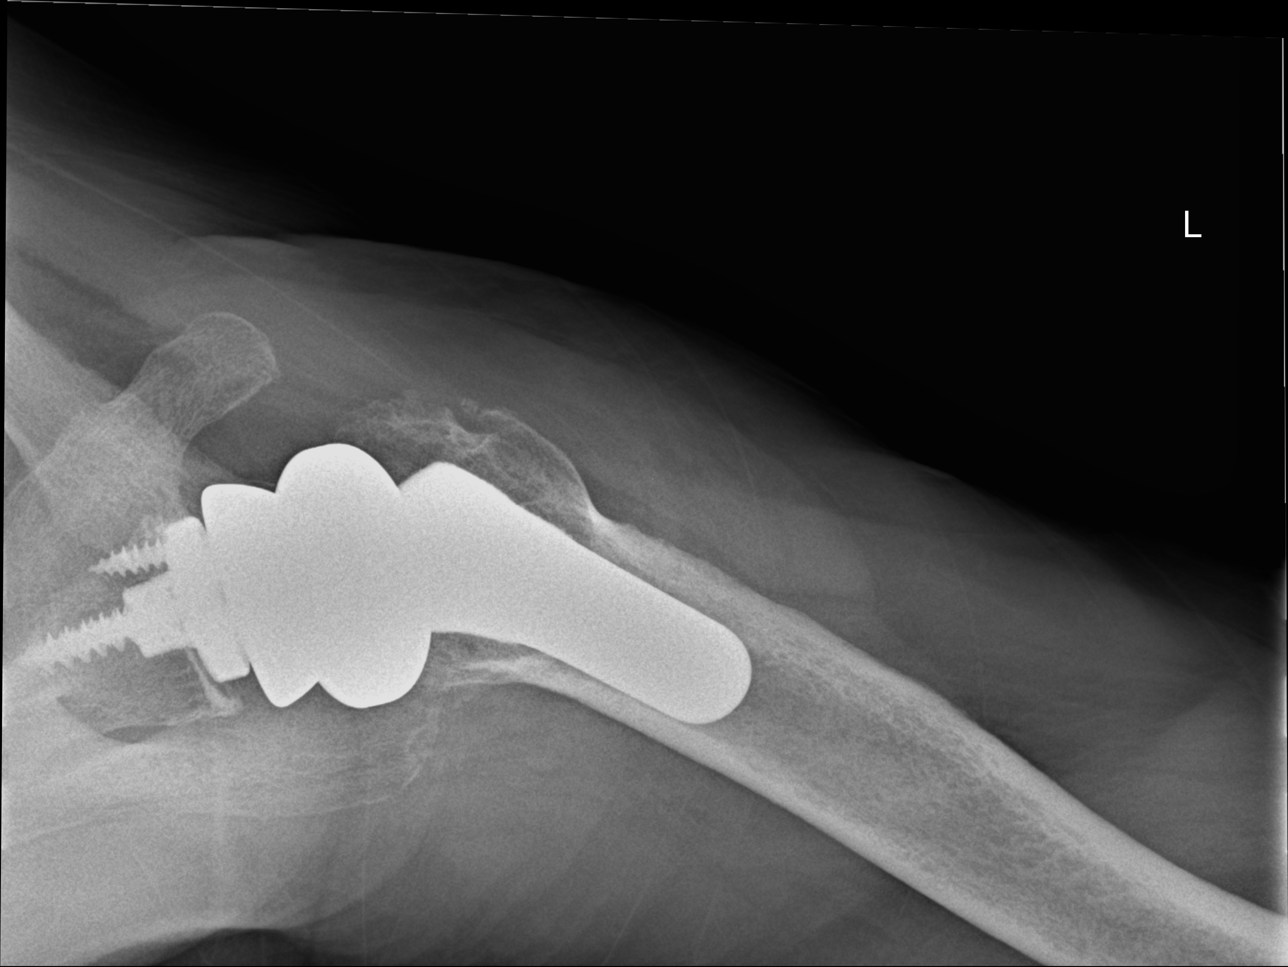

[3 of 3 positions shown; findings below may reference images not displayed]

DIAGNOSTIC STUDIES

EXAM

XR shoulder left, complete

INDICATION

left shoulder pain
Follow up hx of rotator cuff tear.

TECHNIQUE

Internal and external rotated views and axillary views left shoulder

COMPARISONS

September 03, 2020

FINDINGS

Left shoulder reverse arthroplasty demonstrates stable alignment and appearance when compared to
prior exam. No fractures are seen.

IMPRESSION

Stable postop changes left shoulder.

Tech Notes:

Follow up hx of rotator cuff tear.

## 2020-09-24 IMAGING — CR [ID]
2 series · 2 of 2 positions shown · non-contrast
Comparison: none

[chest pa]
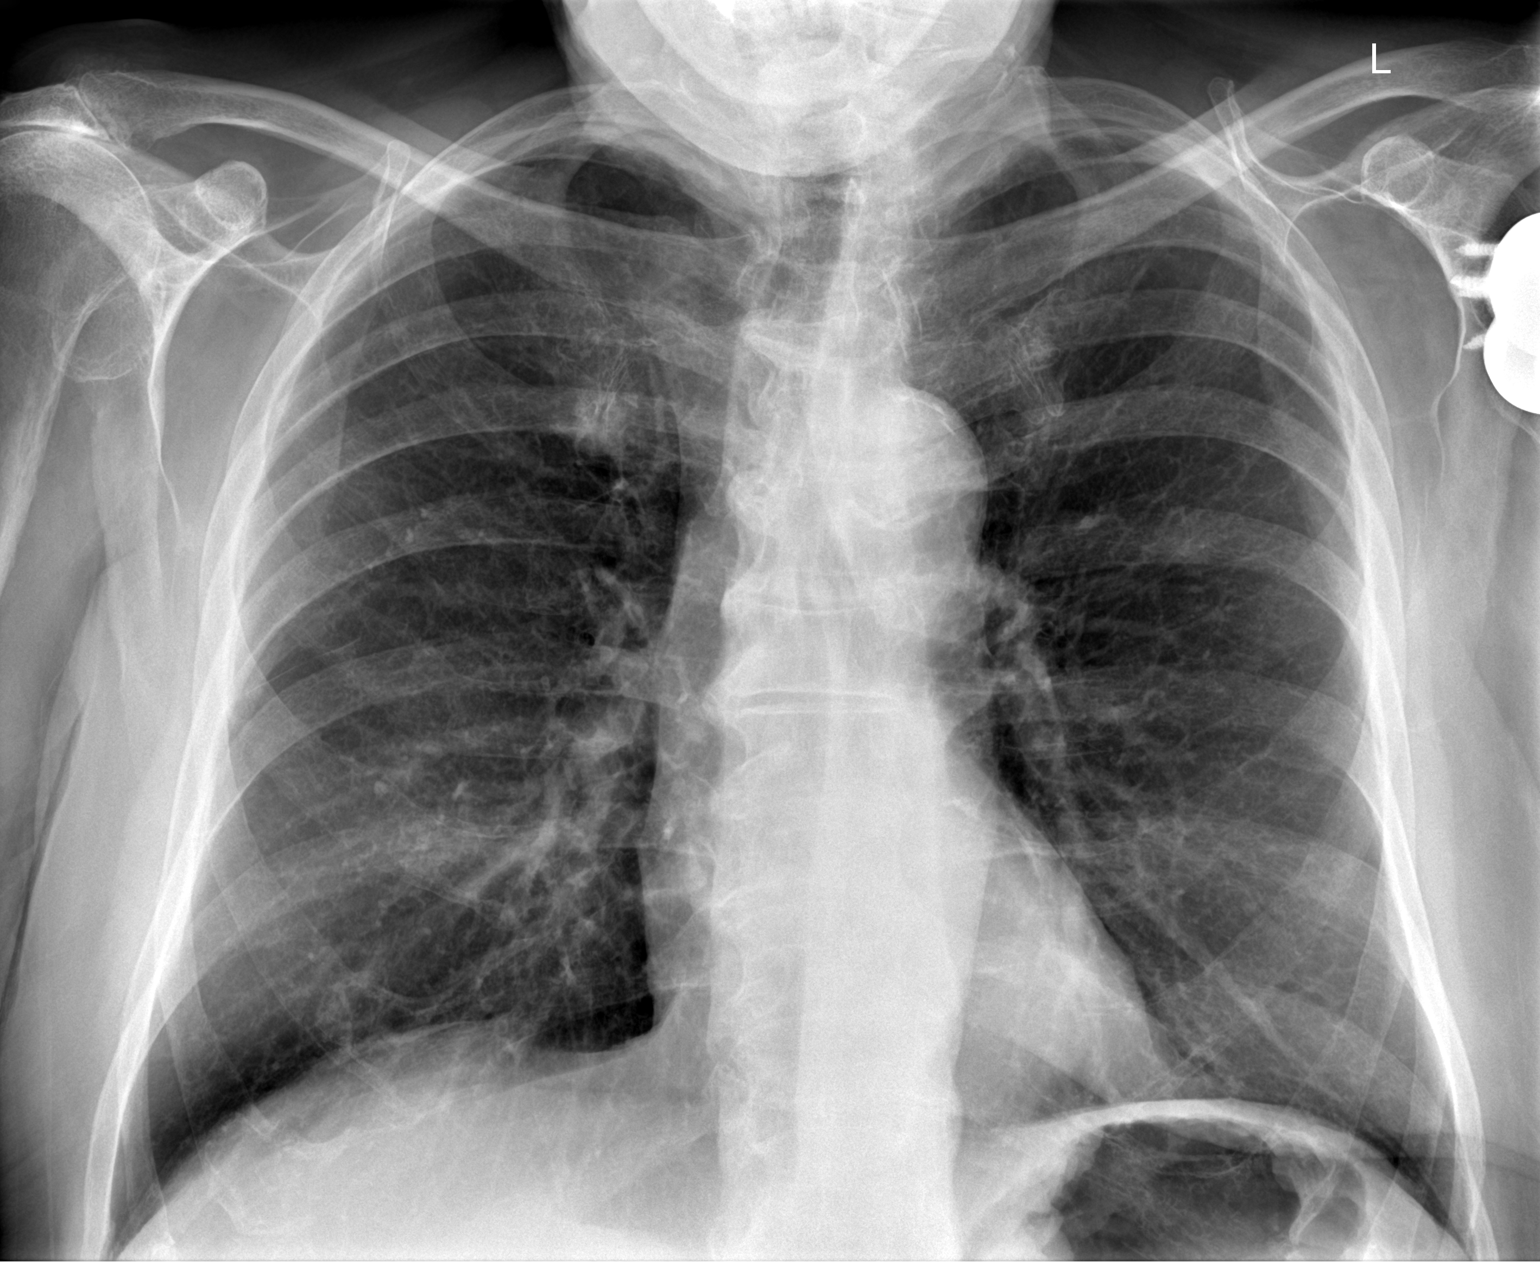

[chest lat]
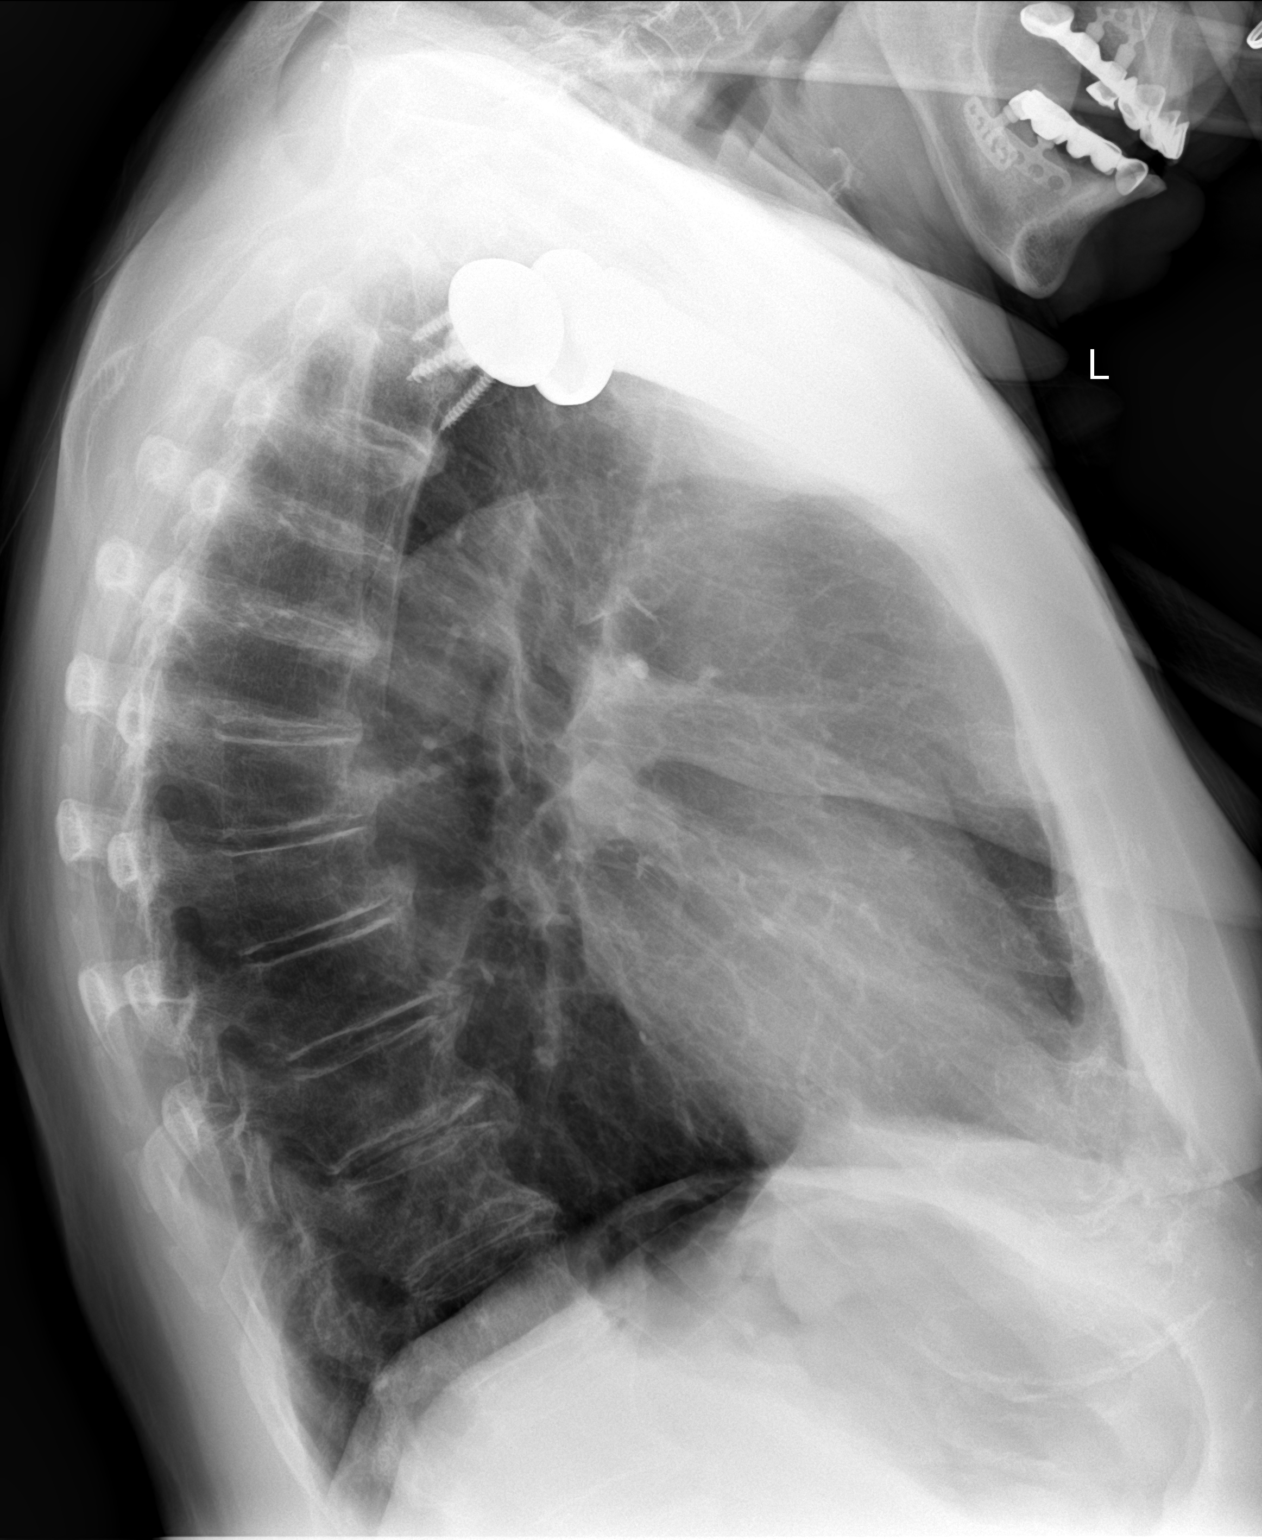

[2 of 2 positions shown; findings below may reference images not displayed]

DIAGNOSTIC STUDIES

EXAM

XR chest 2V

INDICATION

weight loss, SOB, fever
PT C/O SOA, FATIGUE X 2 WEEKS.  LT SHOULDER REPLACED 5 WEEKS AGO.  NO OTHER HEART OR LUNG PROBLEMS
REPORTED.  AB

TECHNIQUE

AP AND LATERAL

COMPARISONS

None

FINDINGS

The heart is normal. No focal consolidation, pleural effusion or pneumothorax. No aggressive
osseous lesions.

IMPRESSION

No acute pulmonary findings.

Tech Notes:

PT C/O SOA, FATIGUE X 2 WEEKS.  LT SHOULDER REPLACED 5 WEEKS AGO.  NO OTHER HEART OR LUNG PROBLEMS
REPORTED.  AB

## 2020-09-29 ENCOUNTER — Encounter: Admit: 2020-09-29 | Discharge: 2020-09-29 | Payer: MEDICARE

## 2020-09-30 IMAGING — MR Abdomen^MRCP
4 of 5 series · 22 of 48 positions shown · non-contrast
Comparison: none

[Series 8: t2_trufi_cor_bh · coronal · 8.0mm · 0.59mm/px · 6 of 18 slices shown]
[im 1/18]
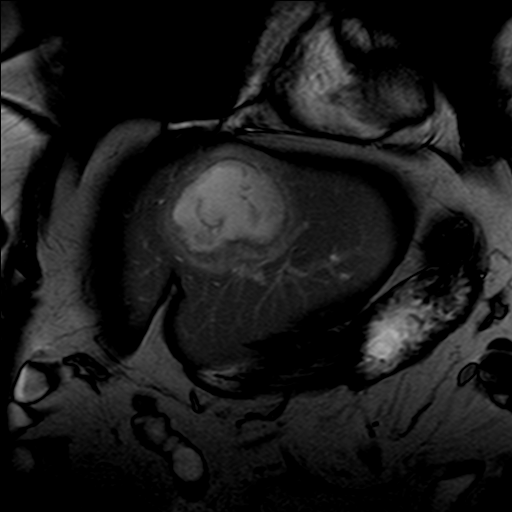
[im 4/18]
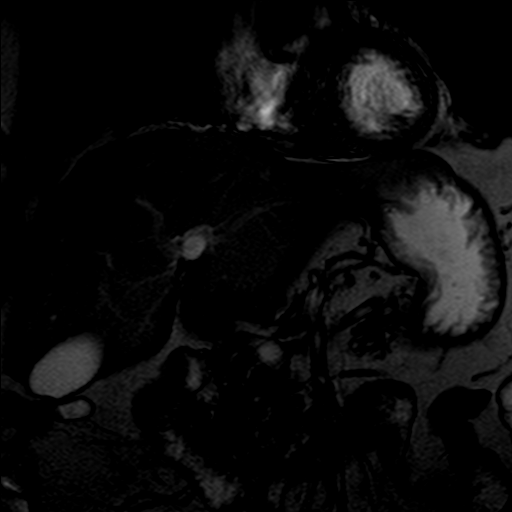
[im 7/18]
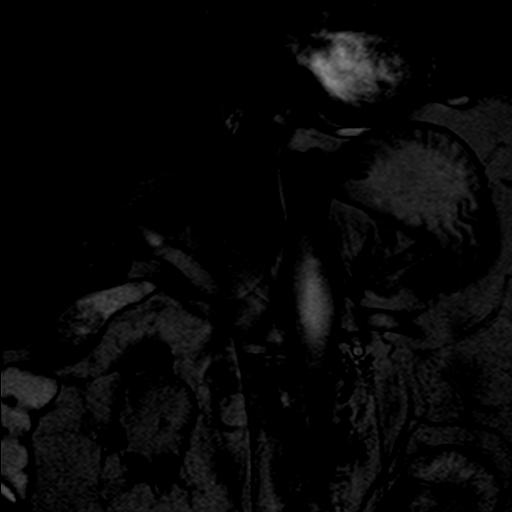
[im 11/18]
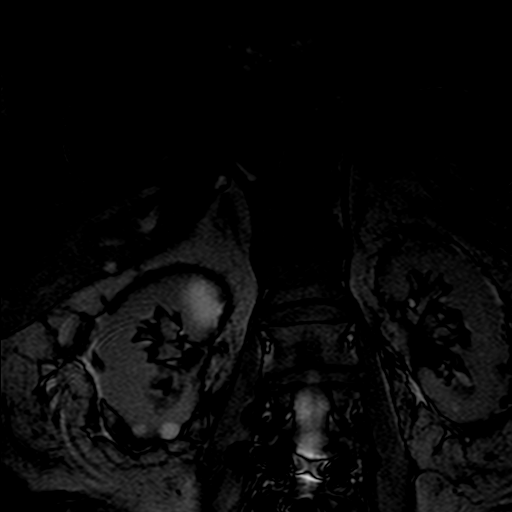
[im 14/18]
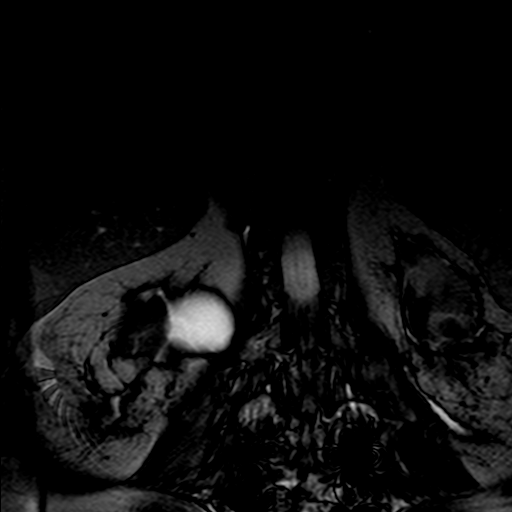
[im 18/18]
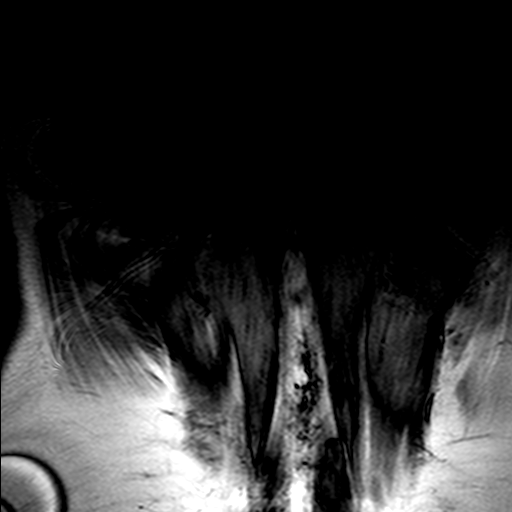

[Series 9: t2_trufi_(person_name)_(person_name) · axial · 8.0mm · 0.68mm/px · z∈[-140,+119]mm · 9 of 28 slices shown]
[im 1/28]
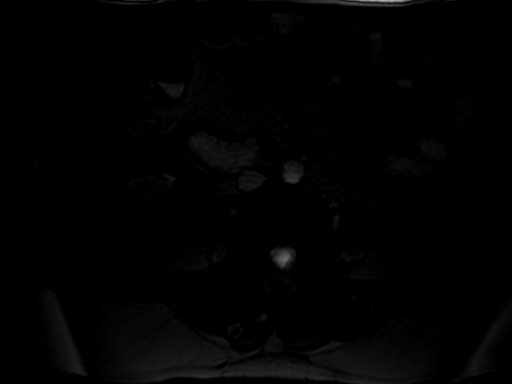
[im 4/28]
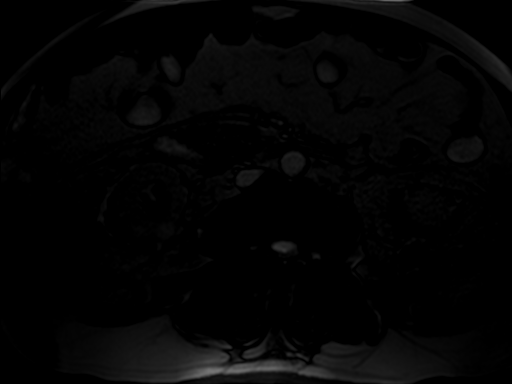
[im 7/28]
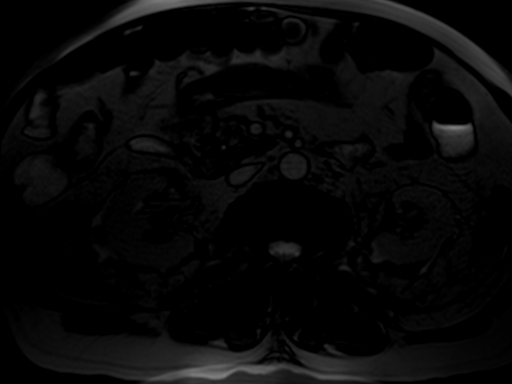
[im 11/28]
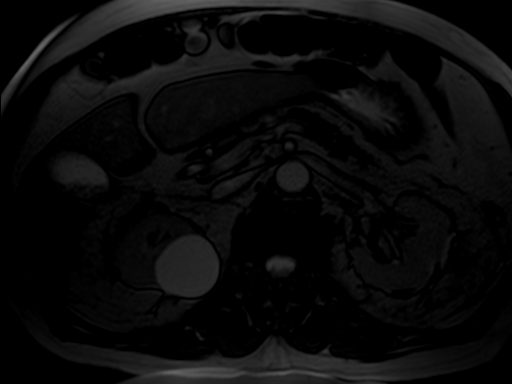
[im 14/28]
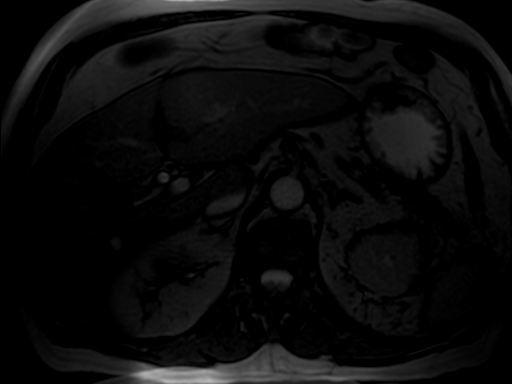
[im 17/28]
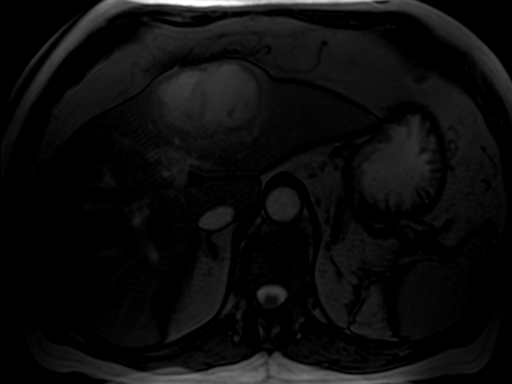
[im 21/28]
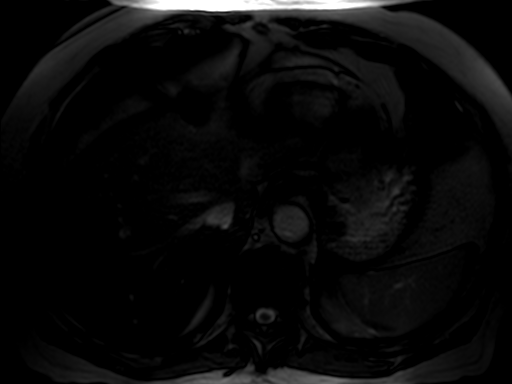
[im 24/28]
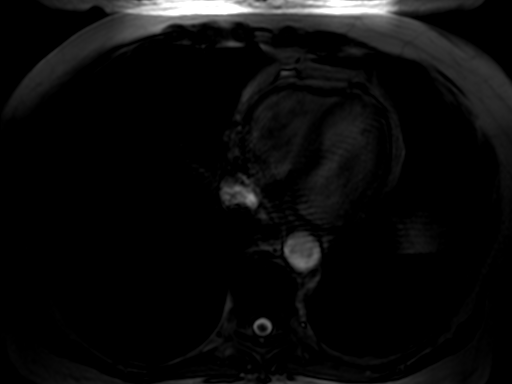
[im 28/28]
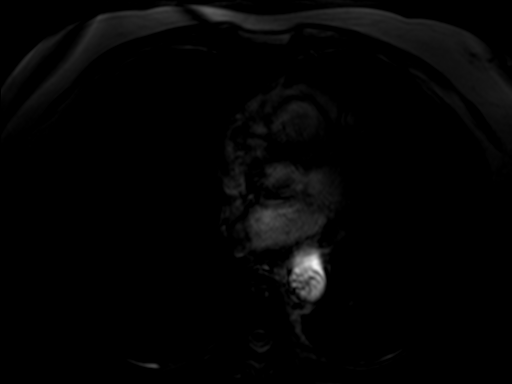

[Series 10: t1_(person_name)2(person_name)_(person_name)_(person_name) · axial · 6.0mm · 0.68mm/px · z∈[-71,+77]mm · 4 of 20 slices shown]
[im 1/20]
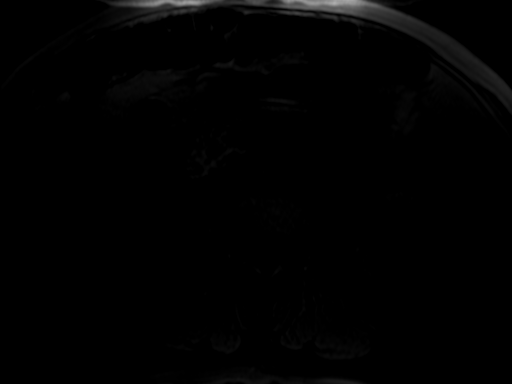
[im 4/20]
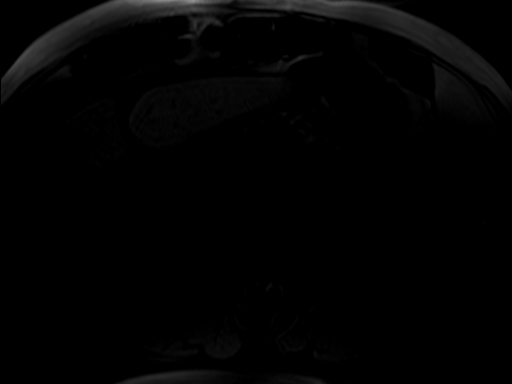
[im 12/20]
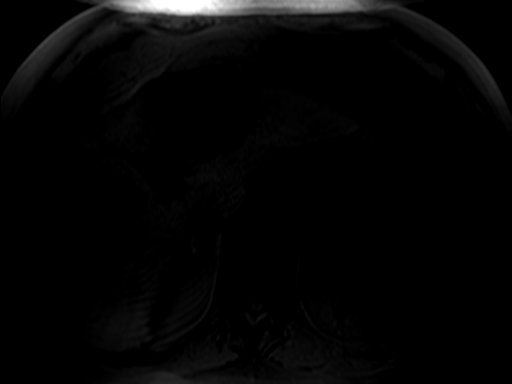
[im 20/20]
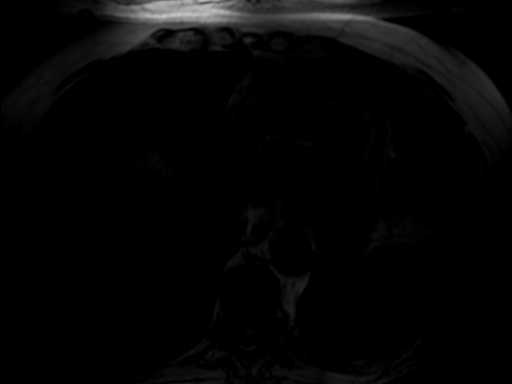

[Series 18: t2_haste_cor_thin_sl_bh · coronal · 2.0mm · 0.59mm/px · 3 of 30 slices shown]
[im 4/30]
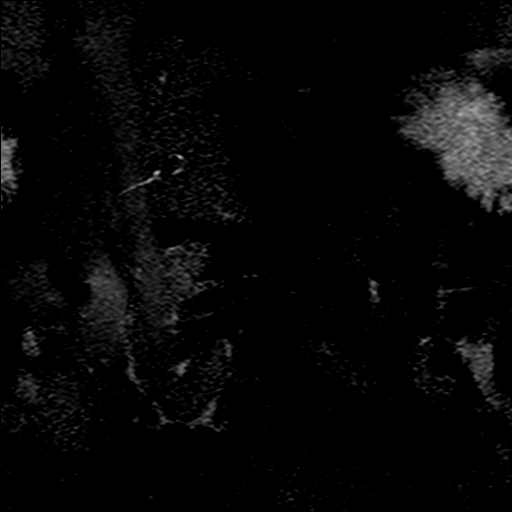
[im 15/30]
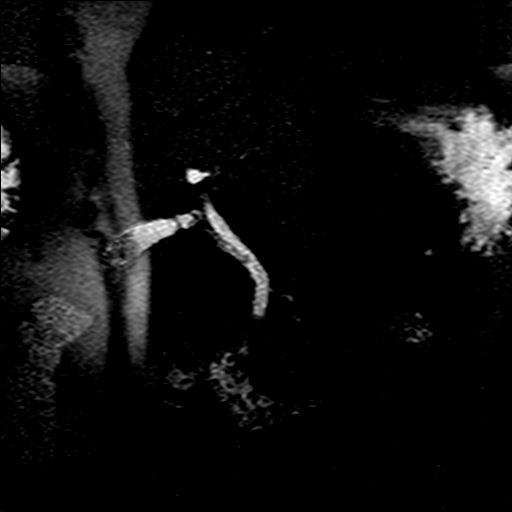
[im 26/30]
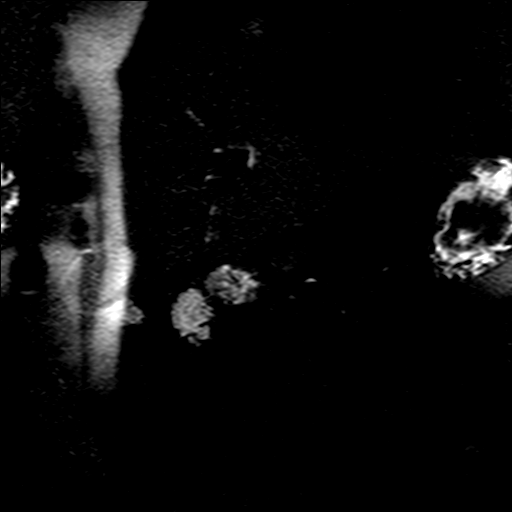

[22 of 48 positions shown; findings below may reference images not displayed]

DIAGNOSTIC STUDIES

EXAM

MAGNETIC RESONANCE IMAGING, ABDOMEN COMBO; WITHOUT CONTRAST MATERIAL(S), FOLLOWED BY WITH CONTRAST
MATERIAL(S) AND FURTHER SEQUENCES, CPT 65109, MRCP:

INDICATION

liver mass
LIVER MASS WITH ELEVATED LIVER ENZYMES.  RG

TECHNIQUE

Multisequence multiplanar MRI of the abdomen was performed before and after uncomplicated
administration of intravenous gadolinium.

COMPARISONS

Ultrasound 09/28/2020.

FINDINGS

The heart appears normal in size. No pericardial effusion. The lung bases are clear. The liver is
normal in size with minimal intrahepatic biliary ductal dilatation. There are several small hepatic
cysts. There is a 5.7 x 7.3 cm cystic lesion with peripheral enhancement within the left hepatic
lobe. The spleen, pancreas and adrenal glands appear grossly unremarkable. Cholelithiasis. No
definite gallbladder wall thickening or pericholecystic fluid. There is a filling defect within the
distal common bile duct measuring 0.7 cm with a craniocaudal dimension of 1.7 cm. There is no
hydronephrosis. Bilateral renal cysts. No bowel obstruction. There is mild gastric mucosal
thickening. No abdominal aortic aneurysm. Subcentimeter retroperitoneal nodes. No focal marrow
lesions.

IMPRESSION

1. There is a 5.7 x 7.3 cm cystic lesion with peripheral enhancement within the left hepatic lobe.
Findings are suspicious for an abscess. A cystic neoplasm is less likely. Follow-up with an
interventional radiology consultation is recommended.

2. Cholelithiasis. Filling defect within the distal common bile duct, suspicious for
choledocholithiasis. Follow-up with a gastroenterology consultation is needed.

3. Gastritis.

Tech Notes:

LIVER MASS WITH ELEVATED LIVER ENZYMES.  RG

## 2020-09-30 IMAGING — MR Abdomen^LIVER
7 of 11 series · 26 of 48 positions shown · non-contrast
Comparison: none

[Series 2: t2_trufi_cor_bh · coronal · 8.0mm · 0.68mm/px · 3 of 20 slices shown]
[im 1/20]
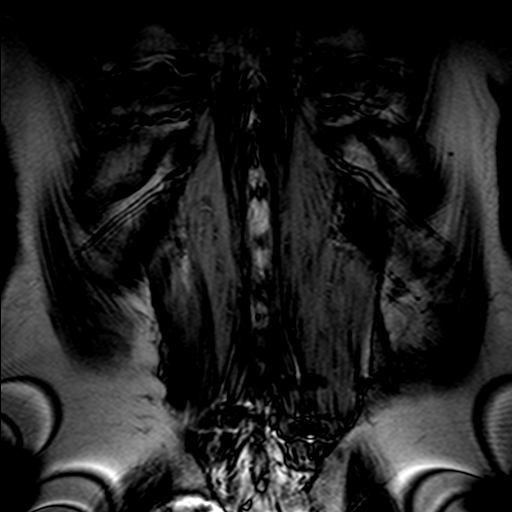
[im 10/20]
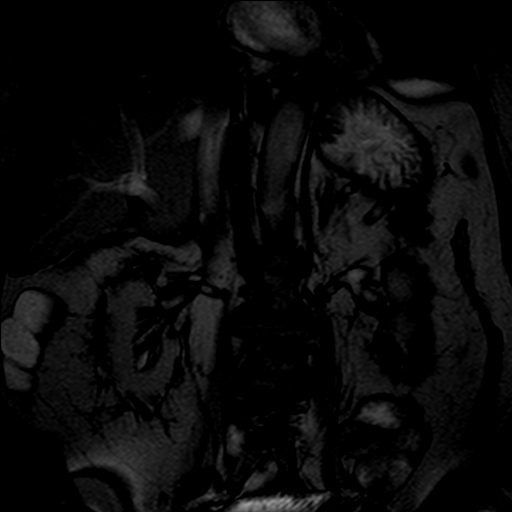
[im 20/20]
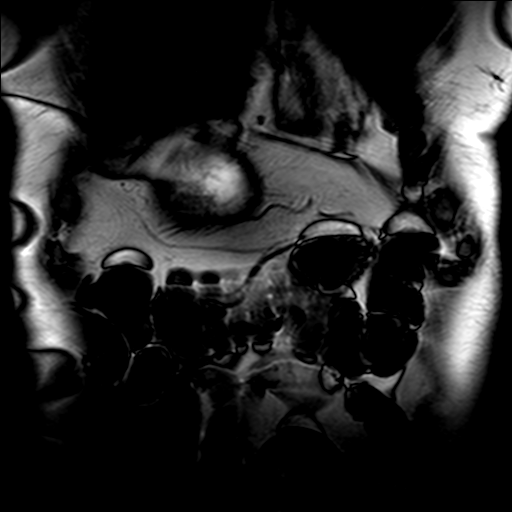

[Series 3: t2_haste_cor · coronal · 8.0mm · 0.68mm/px · 2 of 20 slices shown]
[im 1/20]
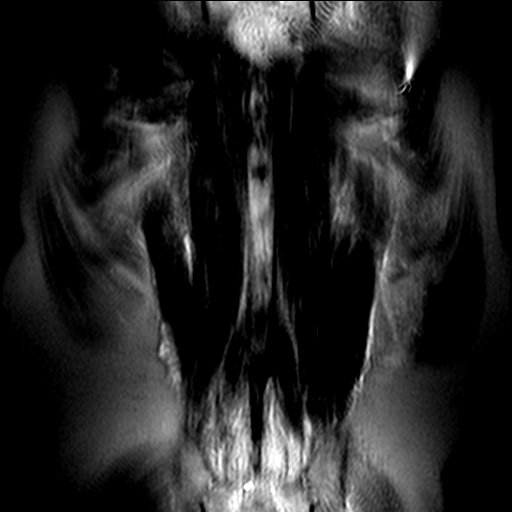
[im 20/20]
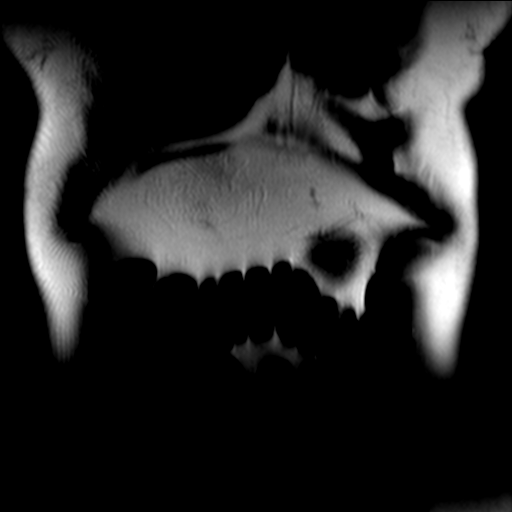

[Series 5: t2_trufi_(person_name)_(person_name) · axial · 8.0mm · 0.68mm/px · 1 of 1 slices shown]
[im 1/1]
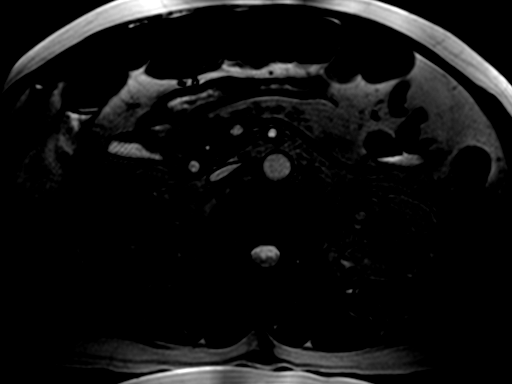

[Series 6: t1_fl2d_in_opp_ph_(person_name)_(person_name) · axial · 8.0mm · 0.68mm/px · z∈[-139,-14]mm · 2 of 21 slices shown]
[im 1/21]
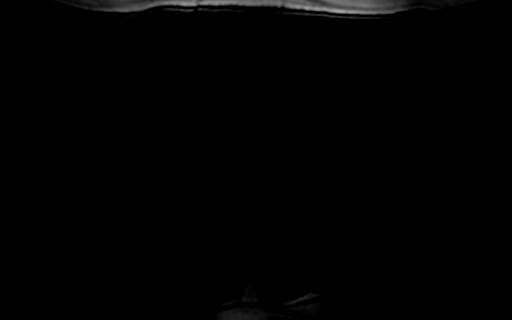
[im 21/21]
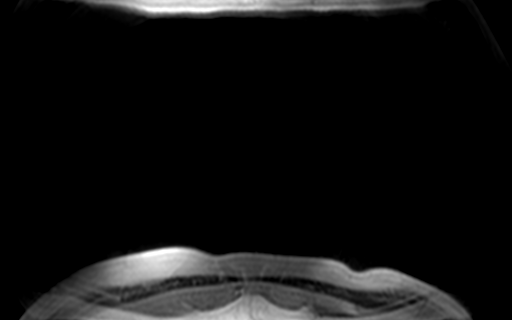

[Series 7: T2 · axial · 8.0mm · 0.68mm/px · z∈[-136,+123]mm · 6 of 56 slices shown]
[im 1/56]
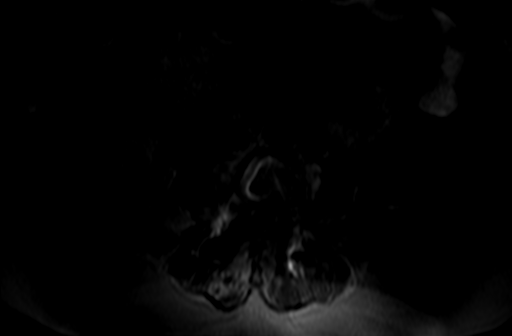
[im 12/56]
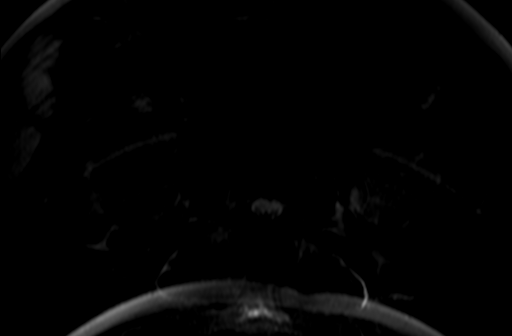
[im 23/56]
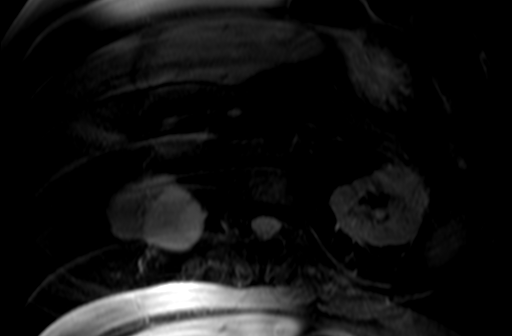
[im 34/56]
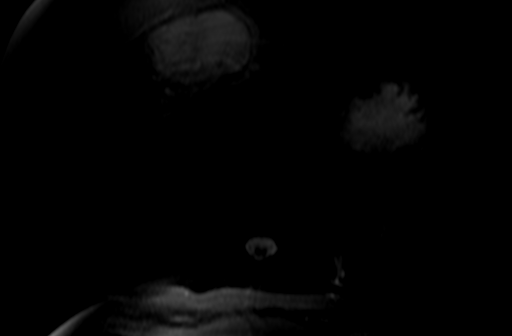
[im 45/56]
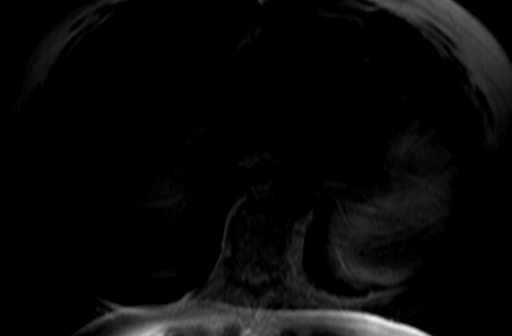
[im 56/56]
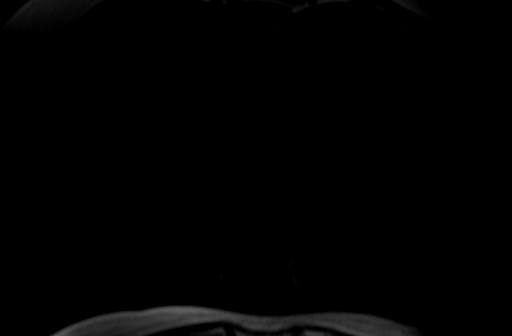

[Series 8: T1 dynamic fat-sat · axial · non-contrast · 3.0mm · 0.68mm/px · z∈[-108,+105]mm · 7 of 72 slices shown]
[im 1/72]
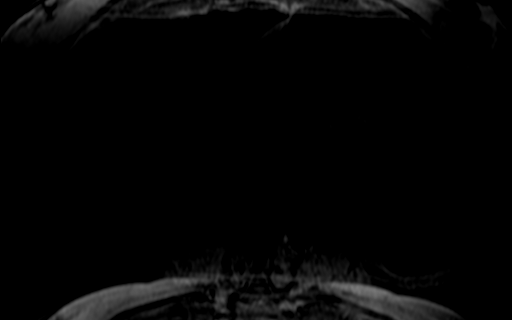
[im 12/72]
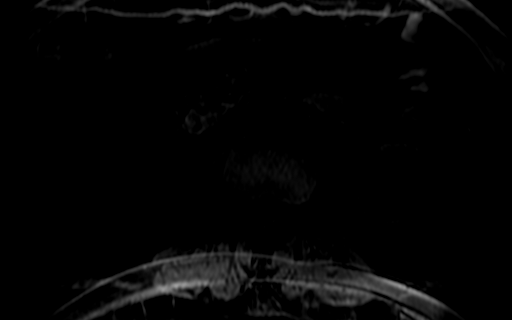
[im 24/72]
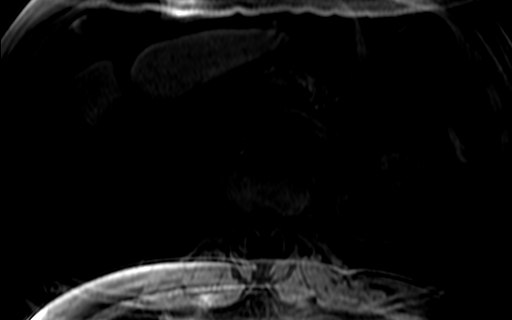
[im 36/72]
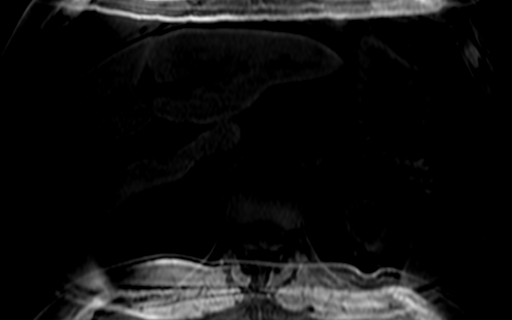
[im 48/72]
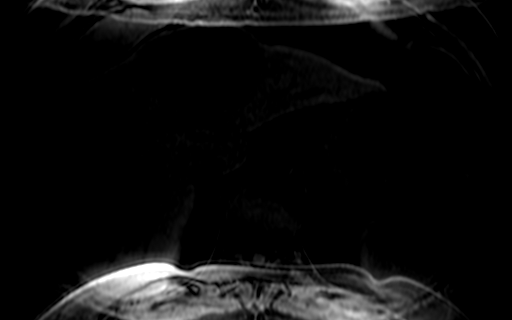
[im 60/72]
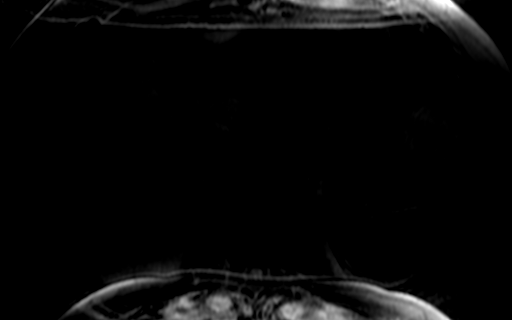
[im 72/72]
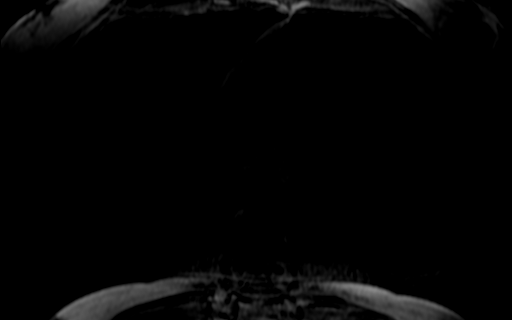

[Series 10: T1 post-contrast · axial · 3.0mm · 0.68mm/px · z∈[-94,+47]mm · 5 of 72 slices shown]
[im 1/72]
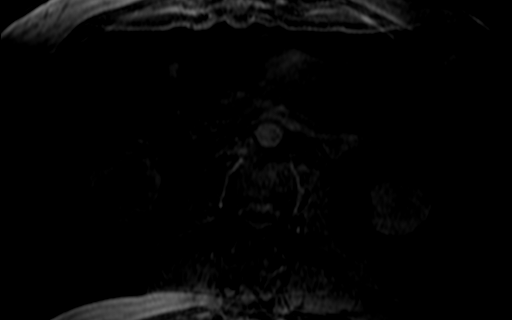
[im 12/72]
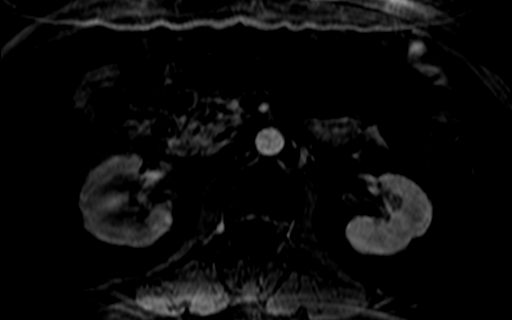
[im 24/72]
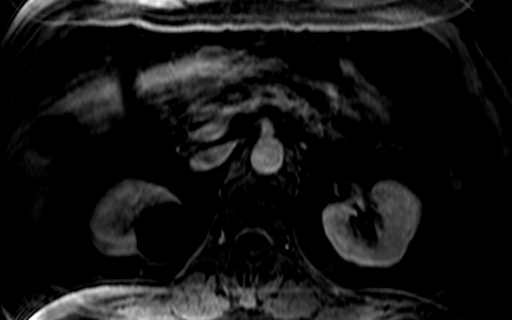
[im 36/72]
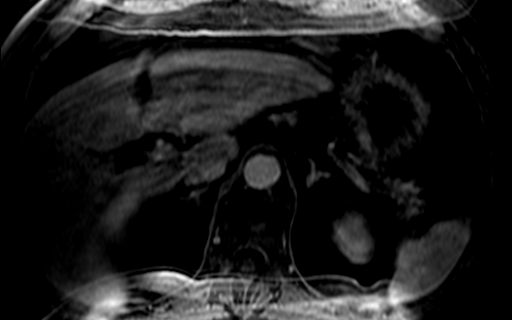
[im 48/72]
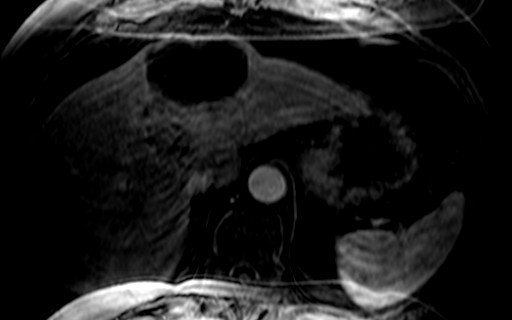

[26 of 48 positions shown; findings below may reference images not displayed]

DIAGNOSTIC STUDIES

EXAM

MAGNETIC RESONANCE IMAGING, ABDOMEN COMBO; WITHOUT CONTRAST MATERIAL(S), FOLLOWED BY WITH CONTRAST
MATERIAL(S) AND FURTHER SEQUENCES, CPT 06290, MRCP:

INDICATION

liver mass
LIVER MASS WITH ELEVATED LIVER ENZYMES.  RG

TECHNIQUE

Multisequence multiplanar MRI of the abdomen was performed before and after uncomplicated
administration of intravenous gadolinium.

COMPARISONS

Ultrasound 09/28/2020.

FINDINGS

The heart appears normal in size. No pericardial effusion. The lung bases are clear. The liver is
normal in size with minimal intrahepatic biliary ductal dilatation. There are several small hepatic
cysts. There is a 5.7 x 7.3 cm cystic lesion with peripheral enhancement within the left hepatic
lobe. The spleen, pancreas and adrenal glands appear grossly unremarkable. Cholelithiasis. No
definite gallbladder wall thickening or pericholecystic fluid. There is a filling defect within the
distal common bile duct measuring 0.7 cm with a craniocaudal dimension of 1.7 cm. There is no
hydronephrosis. Bilateral renal cysts. No bowel obstruction. There is mild gastric mucosal
thickening. No abdominal aortic aneurysm. Subcentimeter retroperitoneal nodes. No focal marrow
lesions.

IMPRESSION

1. There is a 5.7 x 7.3 cm cystic lesion with peripheral enhancement within the left hepatic lobe.
Findings are suspicious for an abscess. A cystic neoplasm is less likely. Follow-up with an
interventional radiology consultation is recommended.

2. Cholelithiasis. Filling defect within the distal common bile duct, suspicious for
choledocholithiasis. Follow-up with a gastroenterology consultation is needed.

3. Gastritis.

Tech Notes:

LIVER MASS WITH ELEVATED LIVER ENZYMES.  15 ML GADAVIST RG

## 2020-11-01 IMAGING — CR SHOULDCMLT
2 series · 2 of 2 positions shown · non-contrast
Comparison: none

[shoulder external]
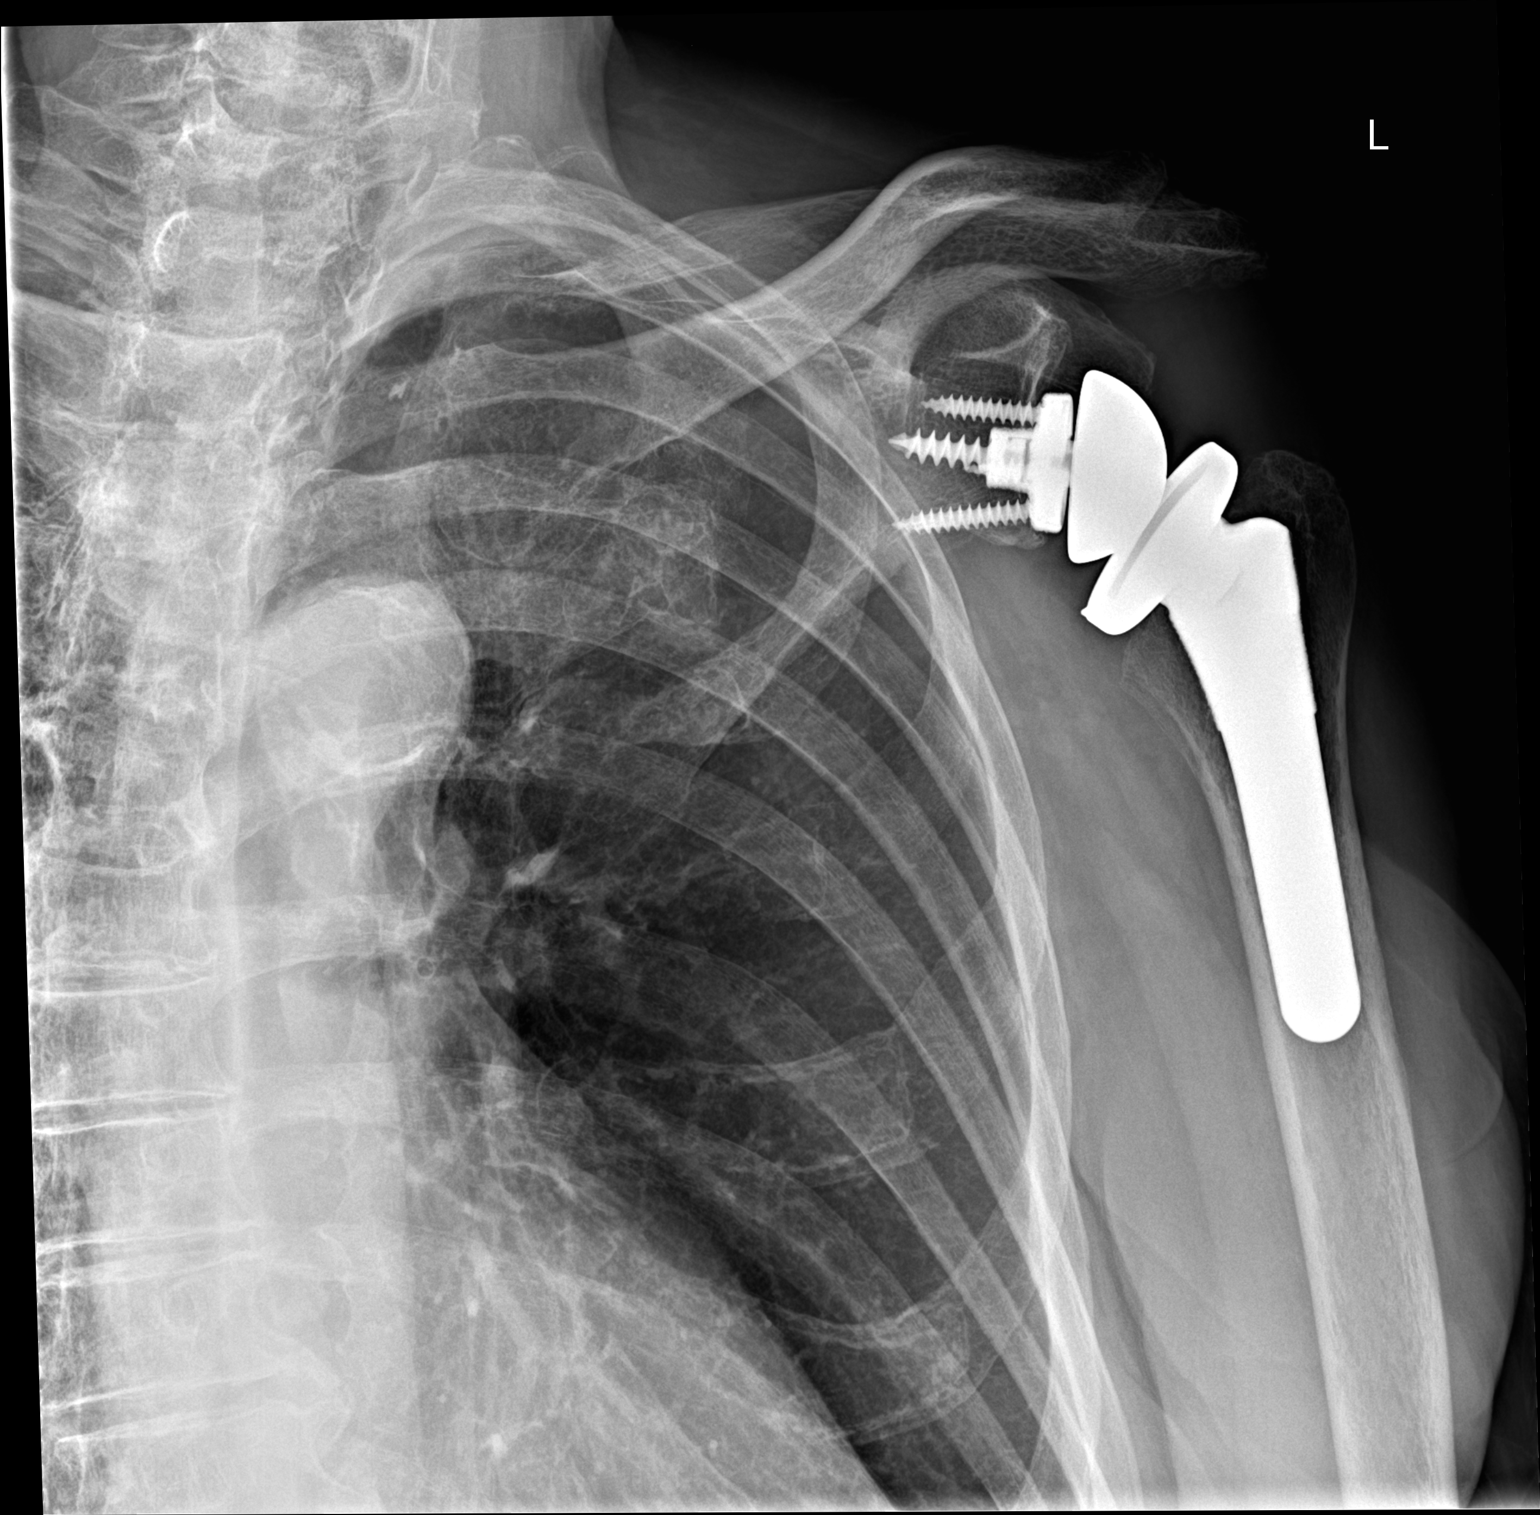

[shoulder y-view]
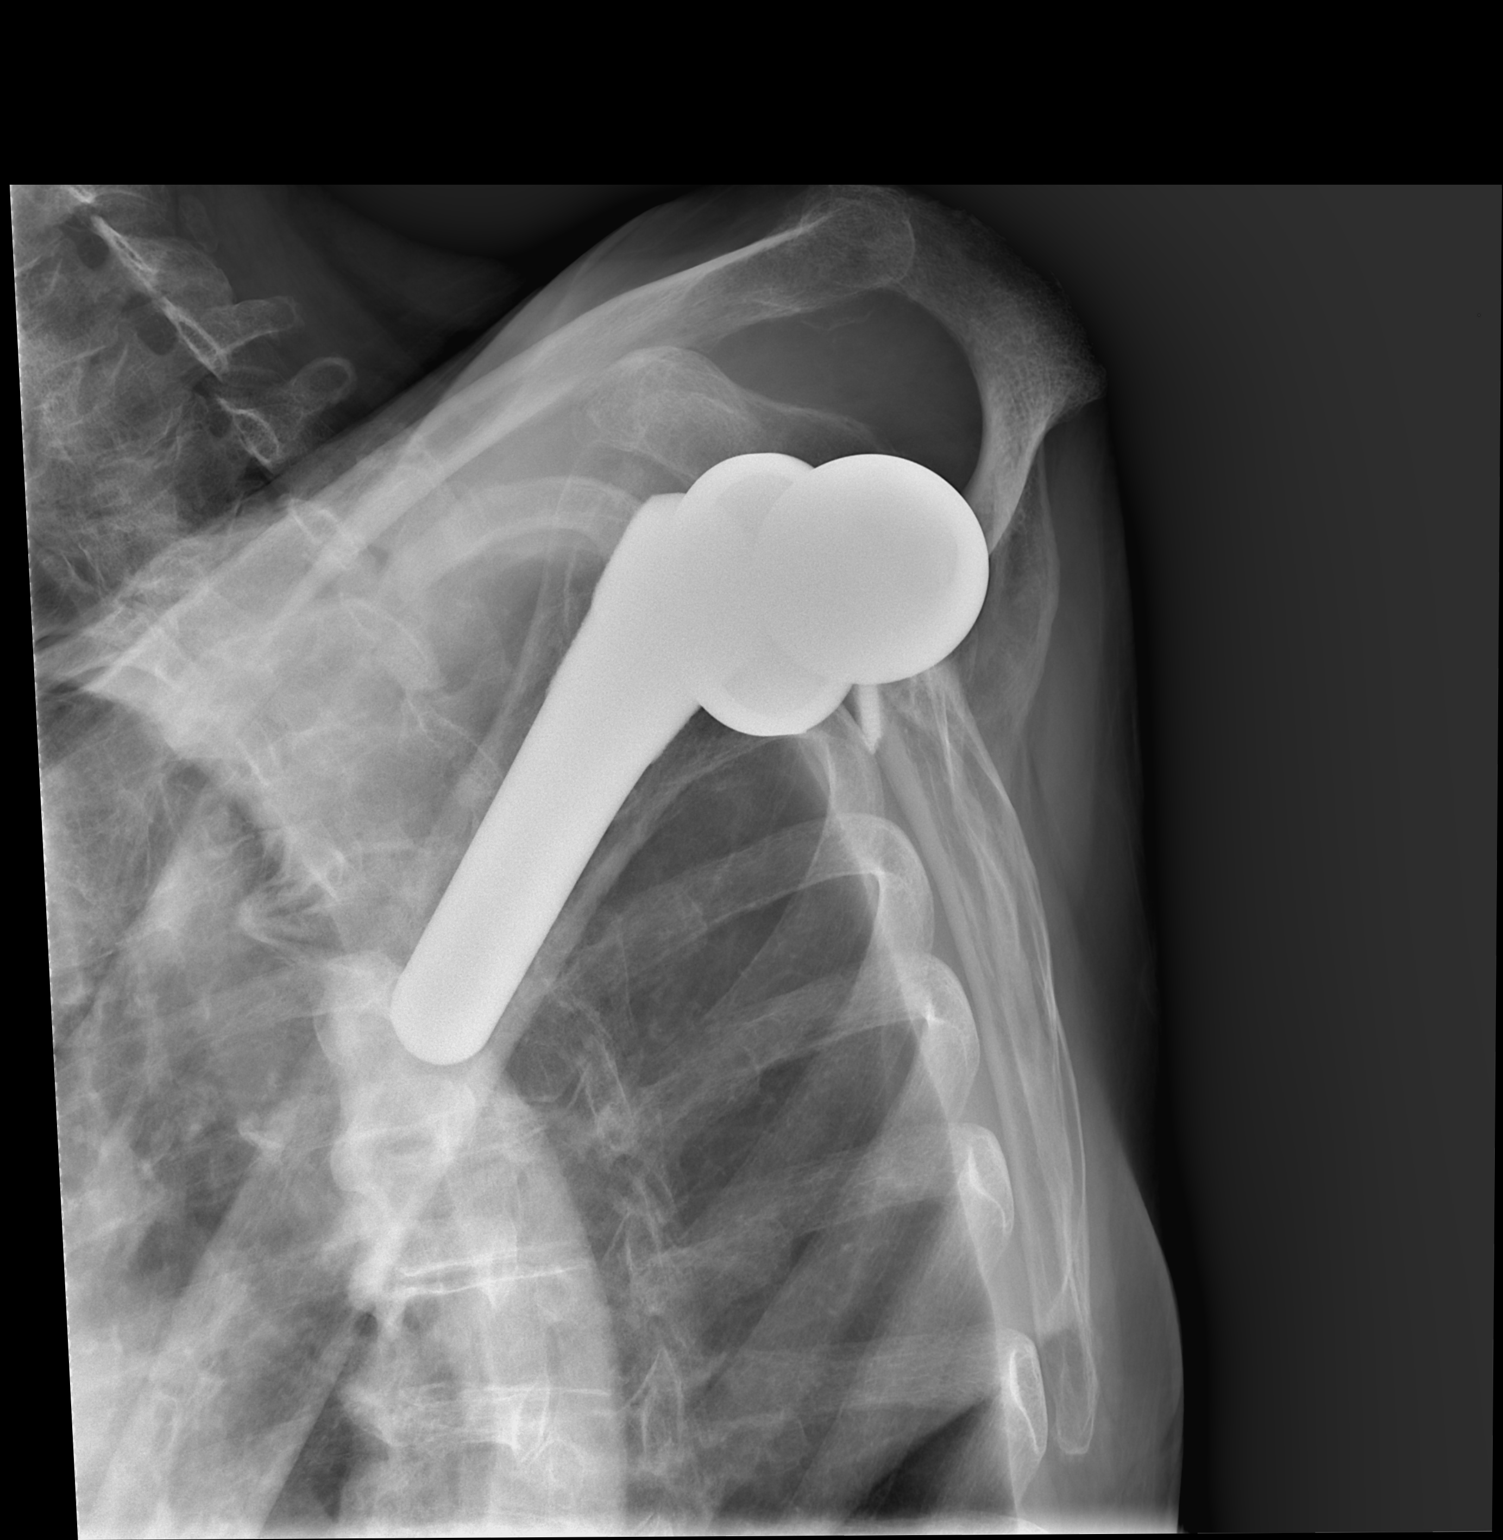

[2 of 2 positions shown; findings below may reference images not displayed]

EXAM

Left shoulder

INDICATION

Post RTSA
POST OP RTSA

FINDINGS

Two views of the left shoulder were obtained. The prior study was reviewed from 09/21/2020.

Left shoulder arthroplasty components are in anatomic position. There has been no change from the
prior study.

IMPRESSION

Anatomic positioned left shoulder arthroplasty components is maintained. No acute abnormality is
present.

Tech Notes:

POST OP RTSA

## 2020-11-04 IMAGING — CR SHOULDCMLT
1 series · 1 of 1 positions shown · non-contrast
Comparison: none

[shoulder axillary]
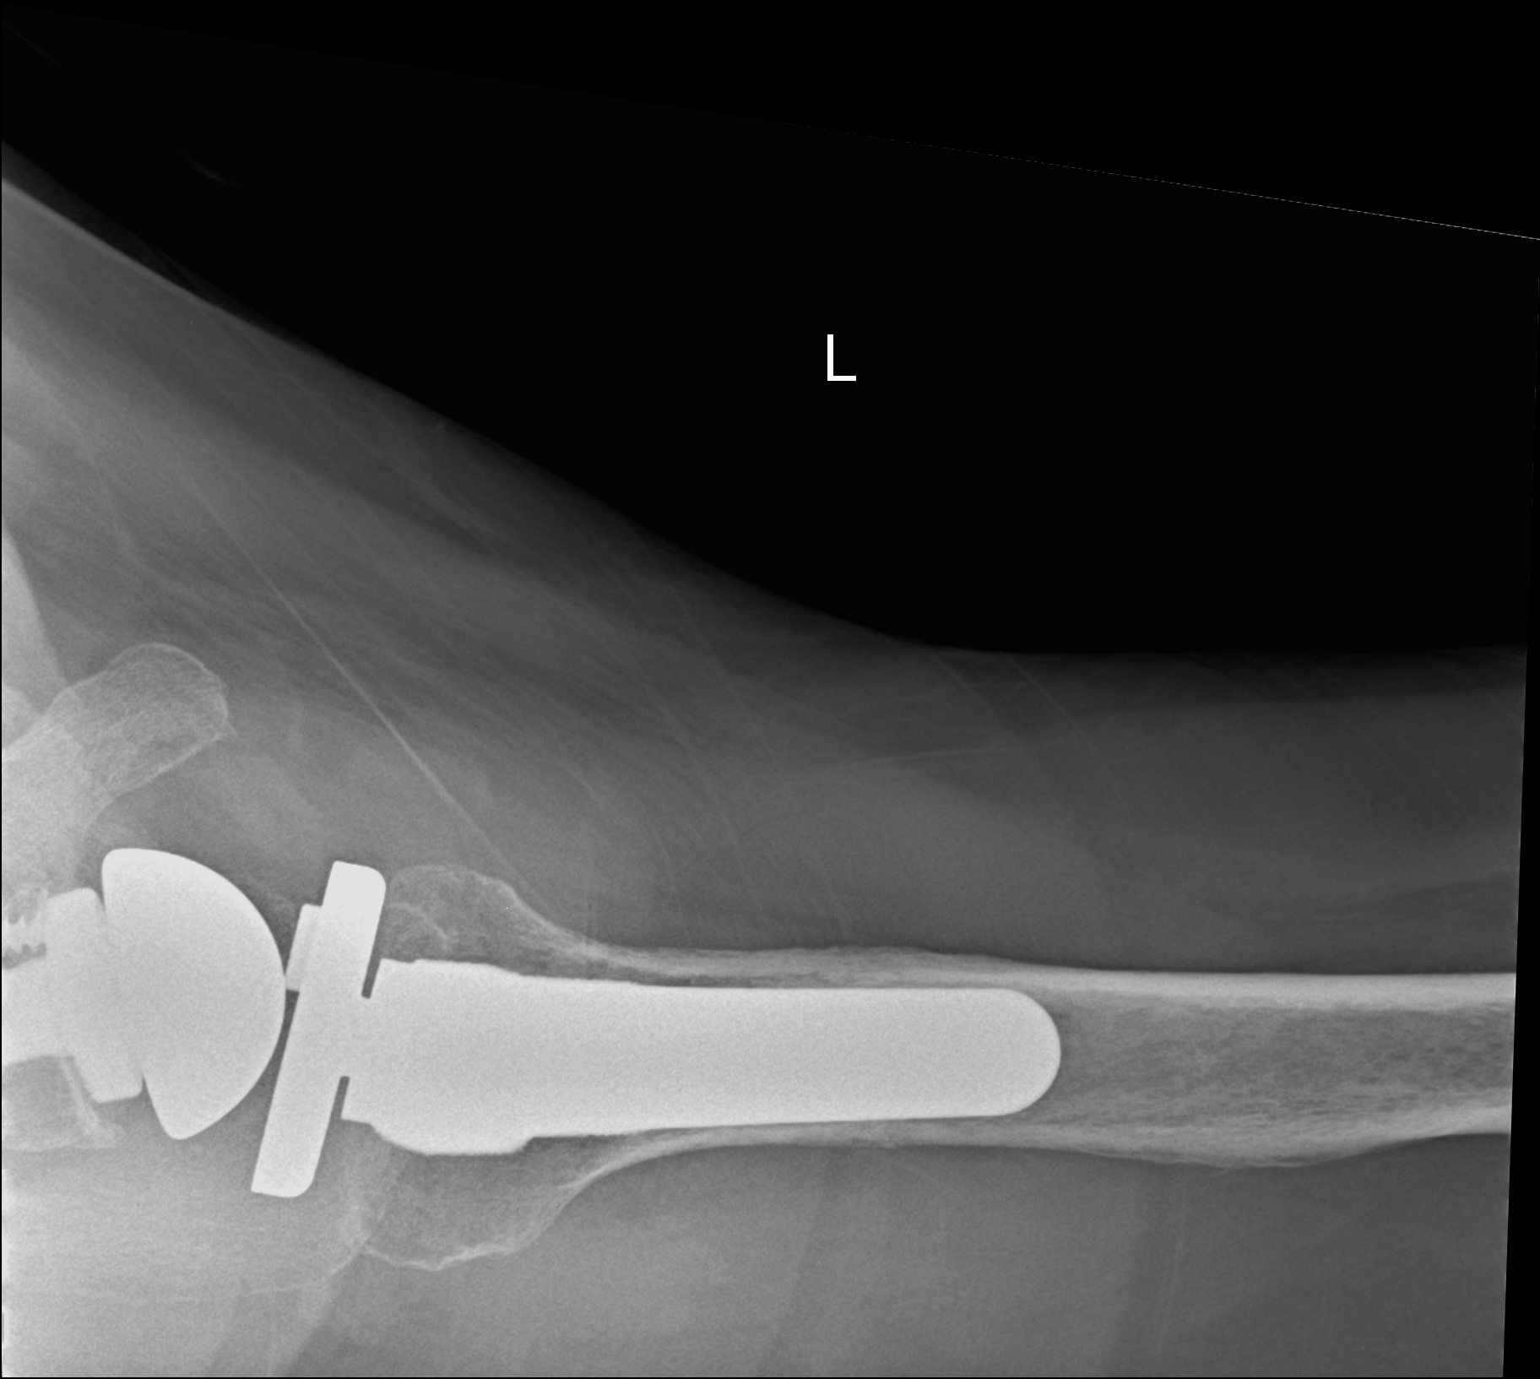

[1 of 1 positions shown; findings below may reference images not displayed]

DIAGNOSTIC STUDIES

EXAM

XR shoulder LT 1V

INDICATION

S/P RTSA Left shoulder
S/P RTSA left shoulder.

TECHNIQUE

Axillary view

COMPARISONS

November 01, 2020

FINDINGS

Reverse left shoulder arthroplasty is noted demonstrating good anatomic alignment. No fractures are
visualized.

IMPRESSION

Postop changes left shoulder as described.

Tech Notes:

S/P RTSA left shoulder.
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
## 2022-01-26 ENCOUNTER — Encounter: Admit: 2022-01-26 | Discharge: 2022-01-26 | Payer: MEDICARE

## 2022-08-14 ENCOUNTER — Encounter: Admit: 2022-08-14 | Discharge: 2022-08-14 | Payer: MEDICARE

## 2023-02-22 ENCOUNTER — Encounter: Admit: 2023-02-22 | Discharge: 2023-02-22 | Payer: MEDICARE

## 2024-03-25 ENCOUNTER — Encounter: Admit: 2024-03-25 | Discharge: 2024-03-25 | Payer: MEDICARE
